# Patient Record
Sex: Male | Born: 1945 | Race: White | Hispanic: No | Smoking: Never smoker
Health system: Southern US, Community
[De-identification: ages and names within clinical notes are randomized; demographics above are authoritative.]

## PROBLEM LIST (undated history)

## (undated) DIAGNOSIS — Z9889 Other specified postprocedural states: Secondary | ICD-10-CM

## (undated) DIAGNOSIS — K529 Noninfective gastroenteritis and colitis, unspecified: Secondary | ICD-10-CM

## (undated) DIAGNOSIS — K900 Celiac disease: Secondary | ICD-10-CM

## (undated) DIAGNOSIS — F0390 Unspecified dementia without behavioral disturbance: Secondary | ICD-10-CM

## (undated) DIAGNOSIS — K632 Fistula of intestine: Secondary | ICD-10-CM

## (undated) DIAGNOSIS — K603 Anal fistula, unspecified: Secondary | ICD-10-CM

## (undated) DIAGNOSIS — C801 Malignant (primary) neoplasm, unspecified: Secondary | ICD-10-CM

## (undated) DIAGNOSIS — Z01818 Encounter for other preprocedural examination: Secondary | ICD-10-CM

## (undated) DIAGNOSIS — D496 Neoplasm of unspecified behavior of brain: Secondary | ICD-10-CM

## (undated) DIAGNOSIS — T859XXA Unspecified complication of internal prosthetic device, implant and graft, initial encounter: Secondary | ICD-10-CM

## (undated) DIAGNOSIS — D649 Anemia, unspecified: Secondary | ICD-10-CM

## (undated) DIAGNOSIS — R1310 Dysphagia, unspecified: Secondary | ICD-10-CM

## (undated) HISTORY — DX: Dysphagia, unspecified: R13.10

## (undated) HISTORY — PX: DEEP BRAIN STIMULATOR PLACEMENT: SHX608

## (undated) HISTORY — DX: Unspecified complication of internal prosthetic device, implant and graft, initial encounter: T85.9XXA

## (undated) HISTORY — DX: Encounter for other preprocedural examination: Z01.818

## (undated) HISTORY — DX: Anal fistula: K60.3

## (undated) HISTORY — DX: Noninfective gastroenteritis and colitis, unspecified: K52.9

## (undated) HISTORY — DX: Malignant (primary) neoplasm, unspecified: C80.1

## (undated) HISTORY — DX: Unspecified dementia, unspecified severity, without behavioral disturbance, psychotic disturbance, mood disturbance, and anxiety: F03.90

## (undated) HISTORY — DX: Anal fistula, unspecified: K60.30

## (undated) HISTORY — DX: Other specified postprocedural states: Z98.890

## (undated) HISTORY — DX: Anemia, unspecified: D64.9

## (undated) HISTORY — PX: PACEMAKER INSERTION: SHX728

## (undated) HISTORY — PX: COLECTOMY: SHX59

## (undated) HISTORY — DX: Fistula of intestine: K63.2

---

## 2009-06-07 ENCOUNTER — Emergency Department (HOSPITAL_COMMUNITY): Admission: EM | Admit: 2009-06-07 | Discharge: 2009-06-07 | Payer: Self-pay | Admitting: Emergency Medicine

## 2009-11-18 ENCOUNTER — Inpatient Hospital Stay (HOSPITAL_COMMUNITY): Admission: EM | Admit: 2009-11-18 | Discharge: 2009-11-27 | Payer: Self-pay | Admitting: Emergency Medicine

## 2009-11-21 ENCOUNTER — Encounter (INDEPENDENT_AMBULATORY_CARE_PROVIDER_SITE_OTHER): Payer: Self-pay | Admitting: General Surgery

## 2009-11-30 ENCOUNTER — Inpatient Hospital Stay (HOSPITAL_COMMUNITY): Admission: EM | Admit: 2009-11-30 | Discharge: 2009-12-03 | Payer: Self-pay | Admitting: Emergency Medicine

## 2009-11-30 ENCOUNTER — Ambulatory Visit: Payer: Self-pay | Admitting: Cardiology

## 2009-12-03 ENCOUNTER — Inpatient Hospital Stay: Admission: AD | Admit: 2009-12-03 | Discharge: 2009-12-28 | Payer: Self-pay | Admitting: Internal Medicine

## 2010-01-24 ENCOUNTER — Emergency Department: Payer: Self-pay | Admitting: Emergency Medicine

## 2010-03-09 ENCOUNTER — Inpatient Hospital Stay: Payer: Self-pay | Admitting: Internal Medicine

## 2010-03-27 ENCOUNTER — Ambulatory Visit: Payer: Self-pay | Admitting: Internal Medicine

## 2010-04-07 ENCOUNTER — Inpatient Hospital Stay: Payer: Self-pay | Admitting: Internal Medicine

## 2010-04-27 ENCOUNTER — Ambulatory Visit: Payer: Self-pay | Admitting: Internal Medicine

## 2010-05-24 ENCOUNTER — Emergency Department: Payer: Self-pay | Admitting: Emergency Medicine

## 2010-06-17 ENCOUNTER — Emergency Department: Payer: Self-pay | Admitting: Emergency Medicine

## 2010-07-10 LAB — BASIC METABOLIC PANEL
BUN: 21 mg/dL (ref 6–23)
Calcium: 7.9 mg/dL — ABNORMAL LOW (ref 8.4–10.5)
Creatinine, Ser: 0.62 mg/dL (ref 0.4–1.5)
Potassium: 3.6 mEq/L (ref 3.5–5.1)

## 2010-07-11 LAB — CBC
HCT: 29.2 % — ABNORMAL LOW (ref 39.0–52.0)
HCT: 30.3 % — ABNORMAL LOW (ref 39.0–52.0)
HCT: 30.9 % — ABNORMAL LOW (ref 39.0–52.0)
HCT: 32.6 % — ABNORMAL LOW (ref 39.0–52.0)
HCT: 27.7 % — ABNORMAL LOW (ref 39.0–52.0)
HCT: 30.5 % — ABNORMAL LOW (ref 39.0–52.0)
HCT: 31.5 % — ABNORMAL LOW (ref 39.0–52.0)
HCT: 33.4 % — ABNORMAL LOW (ref 39.0–52.0)
Hemoglobin: 10.3 g/dL — ABNORMAL LOW (ref 13.0–17.0)
Hemoglobin: 10.7 g/dL — ABNORMAL LOW (ref 13.0–17.0)
Hemoglobin: 11 g/dL — ABNORMAL LOW (ref 13.0–17.0)
Hemoglobin: 9.5 g/dL — ABNORMAL LOW (ref 13.0–17.0)
Hemoglobin: 9.7 g/dL — ABNORMAL LOW (ref 13.0–17.0)
Hemoglobin: 10.4 g/dL — ABNORMAL LOW (ref 13.0–17.0)
Hemoglobin: 10.9 g/dL — ABNORMAL LOW (ref 13.0–17.0)
Hemoglobin: 9 g/dL — ABNORMAL LOW (ref 13.0–17.0)
Hemoglobin: 9.5 g/dL — ABNORMAL LOW (ref 13.0–17.0)
Hemoglobin: 11.1 g/dL — ABNORMAL LOW (ref 13.0–17.0)
MCH: 31 pg (ref 26.0–34.0)
MCH: 31.3 pg (ref 26.0–34.0)
MCH: 31.5 pg (ref 26.0–34.0)
MCH: 32.1 pg (ref 26.0–34.0)
MCH: 32.3 pg (ref 26.0–34.0)
MCH: 32.6 pg (ref 26.0–34.0)
MCH: 32.7 pg (ref 26.0–34.0)
MCH: 32.8 pg (ref 26.0–34.0)
MCH: 31.1 pg (ref 26.0–34.0)
MCHC: 32.4 g/dL (ref 30.0–36.0)
MCHC: 32.8 g/dL (ref 30.0–36.0)
MCHC: 33.2 g/dL (ref 30.0–36.0)
MCHC: 34 g/dL (ref 30.0–36.0)
MCHC: 34.1 g/dL (ref 30.0–36.0)
MCHC: 34.2 g/dL (ref 30.0–36.0)
MCHC: 34.3 g/dL (ref 30.0–36.0)
MCHC: 34.4 g/dL (ref 30.0–36.0)
MCHC: 34.5 g/dL (ref 30.0–36.0)
MCHC: 34.6 g/dL (ref 30.0–36.0)
MCHC: 32.2 g/dL (ref 30.0–36.0)
MCV: 92.1 fL (ref 78.0–100.0)
MCV: 92.4 fL (ref 78.0–100.0)
MCV: 93.6 fL (ref 78.0–100.0)
MCV: 93.9 fL (ref 78.0–100.0)
MCV: 93.9 fL (ref 78.0–100.0)
MCV: 93.8 fL (ref 78.0–100.0)
MCV: 94.5 fL (ref 78.0–100.0)
MCV: 94.6 fL (ref 78.0–100.0)
MCV: 94.6 fL (ref 78.0–100.0)
MCV: 95.2 fL (ref 78.0–100.0)
MCV: 92.4 fL (ref 78.0–100.0)
Platelets: 189 10*3/uL (ref 150–400)
Platelets: 208 10*3/uL (ref 150–400)
Platelets: 210 10*3/uL (ref 150–400)
Platelets: 222 10*3/uL (ref 150–400)
Platelets: 235 10*3/uL (ref 150–400)
Platelets: 401 10*3/uL — ABNORMAL HIGH (ref 150–400)
Platelets: 403 10*3/uL — ABNORMAL HIGH (ref 150–400)
Platelets: 452 10*3/uL — ABNORMAL HIGH (ref 150–400)
Platelets: 266 10*3/uL (ref 150–400)
Platelets: 367 10*3/uL (ref 150–400)
Platelets: 417 10*3/uL — ABNORMAL HIGH (ref 150–400)
RBC: 3.11 MIL/uL — ABNORMAL LOW (ref 4.22–5.81)
RBC: 3.29 MIL/uL — ABNORMAL LOW (ref 4.22–5.81)
RBC: 3.46 MIL/uL — ABNORMAL LOW (ref 4.22–5.81)
RBC: 3.6 MIL/uL — ABNORMAL LOW (ref 4.22–5.81)
RBC: 2.39 MIL/uL — ABNORMAL LOW (ref 4.22–5.81)
RBC: 2.8 MIL/uL — ABNORMAL LOW (ref 4.22–5.81)
RBC: 3.22 MIL/uL — ABNORMAL LOW (ref 4.22–5.81)
RBC: 3.41 MIL/uL — ABNORMAL LOW (ref 4.22–5.81)
RDW: 14.6 % (ref 11.5–15.5)
RDW: 14.8 % (ref 11.5–15.5)
RDW: 15.2 % (ref 11.5–15.5)
RDW: 15.2 % (ref 11.5–15.5)
RDW: 15.5 % (ref 11.5–15.5)
RDW: 15.6 % — ABNORMAL HIGH (ref 11.5–15.5)
RDW: 14.6 % (ref 11.5–15.5)
RDW: 14.8 % (ref 11.5–15.5)
RDW: 14.2 % (ref 11.5–15.5)
RDW: 14.5 % (ref 11.5–15.5)
WBC: 5 10*3/uL (ref 4.0–10.5)
WBC: 5.6 10*3/uL (ref 4.0–10.5)
WBC: 6.7 10*3/uL (ref 4.0–10.5)
WBC: 6.8 10*3/uL (ref 4.0–10.5)
WBC: 9.9 10*3/uL (ref 4.0–10.5)
WBC: 8.6 10*3/uL (ref 4.0–10.5)
WBC: 9.6 10*3/uL (ref 4.0–10.5)
WBC: 5.9 10*3/uL (ref 4.0–10.5)
WBC: 6.4 10*3/uL (ref 4.0–10.5)
WBC: 7.5 10*3/uL (ref 4.0–10.5)

## 2010-07-11 LAB — URINE MICROSCOPIC-ADD ON

## 2010-07-11 LAB — URINALYSIS, ROUTINE W REFLEX MICROSCOPIC
Ketones, ur: 15 mg/dL — AB
Ketones, ur: 15 mg/dL — AB
Nitrite: NEGATIVE
Specific Gravity, Urine: 1.03 (ref 1.005–1.030)
pH: 5.5 (ref 5.0–8.0)

## 2010-07-11 LAB — BASIC METABOLIC PANEL
BUN: 29 mg/dL — ABNORMAL HIGH (ref 6–23)
BUN: 29 mg/dL — ABNORMAL HIGH (ref 6–23)
BUN: 35 mg/dL — ABNORMAL HIGH (ref 6–23)
BUN: 35 mg/dL — ABNORMAL HIGH (ref 6–23)
BUN: 43 mg/dL — ABNORMAL HIGH (ref 6–23)
BUN: 14 mg/dL (ref 6–23)
CO2: 24 mEq/L (ref 19–32)
CO2: 25 mEq/L (ref 19–32)
CO2: 24 mEq/L (ref 19–32)
CO2: 27 mEq/L (ref 19–32)
CO2: 29 mEq/L (ref 19–32)
Calcium: 8 mg/dL — ABNORMAL LOW (ref 8.4–10.5)
Calcium: 6.7 mg/dL — ABNORMAL LOW (ref 8.4–10.5)
Calcium: 7 mg/dL — ABNORMAL LOW (ref 8.4–10.5)
Calcium: 7.3 mg/dL — ABNORMAL LOW (ref 8.4–10.5)
Chloride: 104 mEq/L (ref 96–112)
Chloride: 106 mEq/L (ref 96–112)
Chloride: 107 mEq/L (ref 96–112)
Chloride: 109 mEq/L (ref 96–112)
Chloride: 101 mEq/L (ref 96–112)
Chloride: 101 mEq/L (ref 96–112)
Creatinine, Ser: 0.73 mg/dL (ref 0.4–1.5)
Creatinine, Ser: 0.92 mg/dL (ref 0.4–1.5)
Creatinine, Ser: 0.94 mg/dL (ref 0.4–1.5)
Creatinine, Ser: 0.94 mg/dL (ref 0.4–1.5)
Creatinine, Ser: 0.71 mg/dL (ref 0.4–1.5)
Creatinine, Ser: 0.78 mg/dL (ref 0.4–1.5)
Creatinine, Ser: 0.82 mg/dL (ref 0.4–1.5)
GFR calc Af Amer: >60 mL/min (ref >60)
GFR calc Af Amer: >60 mL/min (ref >60)
GFR calc Af Amer: >60 mL/min (ref >60)
GFR calc Af Amer: >60 mL/min (ref >60)
GFR calc Af Amer: >60 mL/min (ref >60)
GFR calc Af Amer: >60 mL/min (ref >60)
GFR calc Af Amer: >60 mL/min (ref >60)
GFR calc Af Amer: >60 mL/min (ref >60)
GFR calc Af Amer: >60 mL/min (ref >60)
GFR calc non Af Amer: >60 mL/min (ref >60)
GFR calc non Af Amer: >60 mL/min (ref >60)
GFR calc non Af Amer: >60 mL/min (ref >60)
GFR calc non Af Amer: >60 mL/min (ref >60)
GFR calc non Af Amer: >60 mL/min (ref >60)
GFR calc non Af Amer: >60 mL/min (ref >60)
GFR calc non Af Amer: >60 mL/min (ref >60)
GFR calc non Af Amer: >60 mL/min (ref >60)
GFR calc non Af Amer: >60 mL/min (ref >60)
GFR calc non Af Amer: >60 mL/min (ref >60)
Glucose, Bld: 114 mg/dL — ABNORMAL HIGH (ref 70–99)
Glucose, Bld: 116 mg/dL — ABNORMAL HIGH (ref 70–99)
Glucose, Bld: 105 mg/dL — ABNORMAL HIGH (ref 70–99)
Potassium: 3.3 mEq/L — ABNORMAL LOW (ref 3.5–5.1)
Potassium: 3.4 mEq/L — ABNORMAL LOW (ref 3.5–5.1)
Potassium: 3.4 mEq/L — ABNORMAL LOW (ref 3.5–5.1)
Potassium: 3.5 mEq/L (ref 3.5–5.1)
Potassium: 3.6 mEq/L (ref 3.5–5.1)
Potassium: 4.1 mEq/L (ref 3.5–5.1)
Potassium: 4.5 mEq/L (ref 3.5–5.1)
Potassium: 3.2 mEq/L — ABNORMAL LOW (ref 3.5–5.1)
Potassium: 3.8 mEq/L (ref 3.5–5.1)
Sodium: 136 mEq/L (ref 135–145)
Sodium: 138 mEq/L (ref 135–145)
Sodium: 138 mEq/L (ref 135–145)
Sodium: 139 mEq/L (ref 135–145)
Sodium: 140 mEq/L (ref 135–145)
Sodium: 139 mEq/L (ref 135–145)
Sodium: 137 mEq/L (ref 135–145)
Sodium: 139 mEq/L (ref 135–145)

## 2010-07-11 LAB — COMPREHENSIVE METABOLIC PANEL
ALT: 22 U/L (ref 0–53)
AST: 29 U/L (ref 0–37)
AST: 37 U/L (ref 0–37)
AST: 20 U/L (ref 0–37)
AST: 20 U/L (ref 0–37)
Albumin: 1.8 g/dL — ABNORMAL LOW (ref 3.5–5.2)
Albumin: 1.9 g/dL — ABNORMAL LOW (ref 3.5–5.2)
Albumin: 1.6 g/dL — ABNORMAL LOW (ref 3.5–5.2)
Albumin: 1.8 g/dL — ABNORMAL LOW (ref 3.5–5.2)
Albumin: 2.1 g/dL — ABNORMAL LOW (ref 3.5–5.2)
Alkaline Phosphatase: 133 U/L — ABNORMAL HIGH (ref 39–117)
BUN: 25 mg/dL — ABNORMAL HIGH (ref 6–23)
BUN: 32 mg/dL — ABNORMAL HIGH (ref 6–23)
BUN: 34 mg/dL — ABNORMAL HIGH (ref 6–23)
BUN: 11 mg/dL (ref 6–23)
BUN: 11 mg/dL (ref 6–23)
BUN: 12 mg/dL (ref 6–23)
BUN: 13 mg/dL (ref 6–23)
CO2: 28 mEq/L (ref 19–32)
CO2: 28 mEq/L (ref 19–32)
CO2: 29 mEq/L (ref 19–32)
CO2: 32 mEq/L (ref 19–32)
Calcium: 6.7 mg/dL — ABNORMAL LOW (ref 8.4–10.5)
Calcium: 6.8 mg/dL — ABNORMAL LOW (ref 8.4–10.5)
Calcium: 7.2 mg/dL — ABNORMAL LOW (ref 8.4–10.5)
Chloride: 103 mEq/L (ref 96–112)
Chloride: 110 mEq/L (ref 96–112)
Chloride: 111 mEq/L (ref 96–112)
Chloride: 116 mEq/L — ABNORMAL HIGH (ref 96–112)
Chloride: 115 mEq/L — ABNORMAL HIGH (ref 96–112)
Creatinine, Ser: 0.62 mg/dL (ref 0.4–1.5)
Creatinine, Ser: 0.78 mg/dL (ref 0.4–1.5)
Creatinine, Ser: 0.91 mg/dL (ref 0.4–1.5)
Creatinine, Ser: 0.72 mg/dL (ref 0.4–1.5)
Creatinine, Ser: 0.86 mg/dL (ref 0.4–1.5)
Creatinine, Ser: 0.96 mg/dL (ref 0.4–1.5)
Creatinine, Ser: 0.85 mg/dL (ref 0.4–1.5)
GFR calc Af Amer: >60 mL/min (ref >60)
GFR calc Af Amer: >60 mL/min (ref >60)
GFR calc non Af Amer: >60 mL/min (ref >60)
GFR calc non Af Amer: >60 mL/min (ref >60)
GFR calc non Af Amer: >60 mL/min (ref >60)
GFR calc non Af Amer: >60 mL/min (ref >60)
Glucose, Bld: 133 mg/dL — ABNORMAL HIGH (ref 70–99)
Glucose, Bld: 138 mg/dL — ABNORMAL HIGH (ref 70–99)
Glucose, Bld: 133 mg/dL — ABNORMAL HIGH (ref 70–99)
Potassium: 3.7 mEq/L (ref 3.5–5.1)
Potassium: 3.5 mEq/L (ref 3.5–5.1)
Sodium: 140 mEq/L (ref 135–145)
Total Bilirubin: 0.6 mg/dL (ref 0.3–1.2)
Total Bilirubin: 0.6 mg/dL (ref 0.3–1.2)
Total Bilirubin: 1 mg/dL (ref 0.3–1.2)
Total Bilirubin: 1.2 mg/dL (ref 0.3–1.2)
Total Bilirubin: 0.6 mg/dL (ref 0.3–1.2)
Total Protein: 5.8 g/dL — ABNORMAL LOW (ref 6.0–8.3)
Total Protein: 6.4 g/dL (ref 6.0–8.3)
Total Protein: 4.6 g/dL — ABNORMAL LOW (ref 6.0–8.3)
Total Protein: 5.4 g/dL — ABNORMAL LOW (ref 6.0–8.3)

## 2010-07-11 LAB — CLOSTRIDIUM DIFFICILE EIA
C difficile Toxins A+B, EIA: NEGATIVE
C difficile Toxins A+B, EIA: NEGATIVE

## 2010-07-11 LAB — CULTURE, BLOOD (SINGLE)

## 2010-07-11 LAB — CROSSMATCH
ABO/RH(D): A NEG
Antibody Screen: NEGATIVE

## 2010-07-11 LAB — DIFFERENTIAL
Basophils Absolute: 0 10*3/uL (ref 0.0–0.1)
Basophils Absolute: 0 10*3/uL (ref 0.0–0.1)
Basophils Absolute: 0 10*3/uL (ref 0.0–0.1)
Basophils Absolute: 0 10*3/uL (ref 0.0–0.1)
Basophils Absolute: 0 10*3/uL (ref 0.0–0.1)
Basophils Relative: 0 % (ref 0–1)
Basophils Relative: 0 % (ref 0–1)
Basophils Relative: 0 % (ref 0–1)
Eosinophils Absolute: 0 10*3/uL (ref 0.0–0.7)
Eosinophils Absolute: 0.1 10*3/uL (ref 0.0–0.7)
Eosinophils Relative: 1 % (ref 0–5)
Eosinophils Relative: 0 % (ref 0–5)
Lymphocytes Relative: 12 % (ref 12–46)
Lymphocytes Relative: 13 % (ref 12–46)
Lymphocytes Relative: 5 % — ABNORMAL LOW (ref 12–46)
Lymphocytes Relative: 5 % — ABNORMAL LOW (ref 12–46)
Lymphocytes Relative: 5 % — ABNORMAL LOW (ref 12–46)
Lymphocytes Relative: 7 % — ABNORMAL LOW (ref 12–46)
Lymphs Abs: 1.3 10*3/uL (ref 0.7–4.0)
Lymphs Abs: 0.6 10*3/uL — ABNORMAL LOW (ref 0.7–4.0)
Lymphs Abs: 0.6 10*3/uL — ABNORMAL LOW (ref 0.7–4.0)
Monocytes Absolute: 0.7 10*3/uL (ref 0.1–1.0)
Monocytes Absolute: 0.5 10*3/uL (ref 0.1–1.0)
Monocytes Absolute: 0.5 10*3/uL (ref 0.1–1.0)
Monocytes Absolute: 0.5 10*3/uL (ref 0.1–1.0)
Monocytes Absolute: 0.7 10*3/uL (ref 0.1–1.0)
Monocytes Absolute: 1 10*3/uL (ref 0.1–1.0)
Monocytes Relative: 7 % (ref 3–12)
Monocytes Relative: 11 % (ref 3–12)
Monocytes Relative: 4 % (ref 3–12)
Monocytes Relative: 6 % (ref 3–12)
Monocytes Relative: 7 % (ref 3–12)
Neutro Abs: 5.5 10*3/uL (ref 1.7–7.7)
Neutro Abs: 11 10*3/uL — ABNORMAL HIGH (ref 1.7–7.7)
Neutro Abs: 7.1 10*3/uL (ref 1.7–7.7)
Neutro Abs: 7.6 10*3/uL (ref 1.7–7.7)
Neutro Abs: 8.4 10*3/uL — ABNORMAL HIGH (ref 1.7–7.7)
Neutro Abs: 9.5 10*3/uL — ABNORMAL HIGH (ref 1.7–7.7)
Neutrophils Relative %: 81 % — ABNORMAL HIGH (ref 43–77)
Neutrophils Relative %: 87 % — ABNORMAL HIGH (ref 43–77)
Neutrophils Relative %: 92 % — ABNORMAL HIGH (ref 43–77)

## 2010-07-11 LAB — BLOOD GAS, ARTERIAL
Acid-Base Excess: 1.2 mmol/L (ref 0.0–2.0)
Bicarbonate: 25.8 mEq/L — ABNORMAL HIGH (ref 20.0–24.0)
Delivery systems: POSITIVE
Expiratory PAP: 8
FIO2: 100 %
Inspiratory PAP: 16
O2 Content: 6 L/min
O2 Saturation: 98.8 %
Patient temperature: 37
TCO2: 21.9 mmol/L (ref 0–100)
pCO2 arterial: 35 mmHg (ref 35.0–45.0)
pH, Arterial: 7.443 (ref 7.350–7.450)
pO2, Arterial: 146 mmHg — ABNORMAL HIGH (ref 80.0–100.0)

## 2010-07-11 LAB — URINE CULTURE
Colony Count: 60000
Colony Count: NO GROWTH
Culture  Setup Time: 201108211832
Culture  Setup Time: 201108252238

## 2010-07-11 LAB — PHOSPHORUS
Phosphorus: 2.7 mg/dL (ref 2.3–4.6)
Phosphorus: 3.7 mg/dL (ref 2.3–4.6)
Phosphorus: 3.2 mg/dL (ref 2.3–4.6)
Phosphorus: 3.2 mg/dL (ref 2.3–4.6)

## 2010-07-11 LAB — CULTURE, BLOOD (ROUTINE X 2)
Culture: NO GROWTH
Culture: NO GROWTH

## 2010-07-11 LAB — VITAMIN D 25 HYDROXY (VIT D DEFICIENCY, FRACTURES)
Vit D, 25-Hydroxy: 29 ng/mL — ABNORMAL LOW (ref 30–89)
Vit D, 25-Hydroxy: 10 ng/mL — ABNORMAL LOW (ref 30–89)

## 2010-07-11 LAB — MAGNESIUM
Magnesium: 1.9 mg/dL (ref 1.5–2.5)
Magnesium: 2.1 mg/dL (ref 1.5–2.5)
Magnesium: 1.8 mg/dL (ref 1.5–2.5)
Magnesium: 1.9 mg/dL (ref 1.5–2.5)
Magnesium: 2.1 mg/dL (ref 1.5–2.5)
Magnesium: 2.1 mg/dL (ref 1.5–2.5)

## 2010-07-11 LAB — CULTURE, RESPIRATORY W GRAM STAIN

## 2010-07-11 LAB — MRSA PCR SCREENING: MRSA by PCR: NEGATIVE

## 2010-07-11 LAB — GLUCOSE, CAPILLARY: Glucose-Capillary: 88 mg/dL (ref 70–99)

## 2010-07-11 LAB — FERRITIN: Ferritin: 928 ng/mL — ABNORMAL HIGH (ref 22–322)

## 2010-07-11 LAB — IRON: Iron: 35 ug/dL — ABNORMAL LOW (ref 42–135)

## 2010-07-11 LAB — VANCOMYCIN, TROUGH: Vancomycin Tr: 23.1 ug/mL — ABNORMAL HIGH (ref 10.0–20.0)

## 2010-07-11 LAB — PREALBUMIN: Prealbumin: 4.7 mg/dL — ABNORMAL LOW (ref 18.0–45.0)

## 2010-07-11 LAB — ALBUMIN
Albumin: 2.2 g/dL — ABNORMAL LOW (ref 3.5–5.2)
Albumin: 2.2 g/dL — ABNORMAL LOW (ref 3.5–5.2)

## 2010-07-11 LAB — HIGH SENSITIVITY CRP: CRP, High Sensitivity: 33 mg/L — ABNORMAL HIGH

## 2010-07-12 LAB — BASIC METABOLIC PANEL
BUN: 18 mg/dL (ref 6–23)
BUN: 21 mg/dL (ref 6–23)
BUN: 23 mg/dL (ref 6–23)
BUN: 23 mg/dL (ref 6–23)
BUN: 25 mg/dL — ABNORMAL HIGH (ref 6–23)
CO2: 22 mEq/L (ref 19–32)
CO2: 23 mEq/L (ref 19–32)
CO2: 23 mEq/L (ref 19–32)
CO2: 24 mEq/L (ref 19–32)
Calcium: 9 mg/dL (ref 8.4–10.5)
Calcium: 9.2 mg/dL (ref 8.4–10.5)
Calcium: 9.4 mg/dL (ref 8.4–10.5)
Chloride: 111 mEq/L (ref 96–112)
Chloride: 111 mEq/L (ref 96–112)
Chloride: 111 mEq/L (ref 96–112)
Chloride: 112 mEq/L (ref 96–112)
Creatinine, Ser: 0.83 mg/dL (ref 0.4–1.5)
Creatinine, Ser: 0.88 mg/dL (ref 0.4–1.5)
Creatinine, Ser: 0.99 mg/dL (ref 0.4–1.5)
Creatinine, Ser: 1.02 mg/dL (ref 0.4–1.5)
GFR calc Af Amer: >60 mL/min (ref >60)
GFR calc Af Amer: >60 mL/min (ref >60)
GFR calc Af Amer: >60 mL/min (ref >60)
GFR calc non Af Amer: >60 mL/min (ref >60)
GFR calc non Af Amer: >60 mL/min (ref >60)
GFR calc non Af Amer: >60 mL/min (ref >60)
GFR calc non Af Amer: >60 mL/min (ref >60)
Glucose, Bld: 110 mg/dL — ABNORMAL HIGH (ref 70–99)
Glucose, Bld: 114 mg/dL — ABNORMAL HIGH (ref 70–99)
Glucose, Bld: 147 mg/dL — ABNORMAL HIGH (ref 70–99)
Potassium: 2.7 mEq/L — CL (ref 3.5–5.1)
Potassium: 3 mEq/L — ABNORMAL LOW (ref 3.5–5.1)
Potassium: 3.9 mEq/L (ref 3.5–5.1)
Sodium: 140 mEq/L (ref 135–145)
Sodium: 142 mEq/L (ref 135–145)
Sodium: 144 mEq/L (ref 135–145)

## 2010-07-12 LAB — CBC
HCT: 31.8 % — ABNORMAL LOW (ref 39.0–52.0)
HCT: 36.5 % — ABNORMAL LOW (ref 39.0–52.0)
Hemoglobin: 11.1 g/dL — ABNORMAL LOW (ref 13.0–17.0)
Hemoglobin: 13.3 g/dL (ref 13.0–17.0)
Hemoglobin: 13.7 g/dL (ref 13.0–17.0)
MCH: 32.6 pg (ref 26.0–34.0)
MCH: 32.7 pg (ref 26.0–34.0)
MCH: 32.9 pg (ref 26.0–34.0)
MCHC: 34.5 g/dL (ref 30.0–36.0)
MCHC: 34.5 g/dL (ref 30.0–36.0)
MCHC: 34.5 g/dL (ref 30.0–36.0)
MCHC: 35 g/dL (ref 30.0–36.0)
MCHC: 35.2 g/dL (ref 30.0–36.0)
MCV: 93 fL (ref 78.0–100.0)
MCV: 94.4 fL (ref 78.0–100.0)
Platelets: 125 10*3/uL — ABNORMAL LOW (ref 150–400)
Platelets: 132 10*3/uL — ABNORMAL LOW (ref 150–400)
Platelets: 153 10*3/uL (ref 150–400)
RBC: 3.37 MIL/uL — ABNORMAL LOW (ref 4.22–5.81)
RBC: 3.39 MIL/uL — ABNORMAL LOW (ref 4.22–5.81)
RBC: 3.93 MIL/uL — ABNORMAL LOW (ref 4.22–5.81)
RDW: 14.8 % (ref 11.5–15.5)
RDW: 15 % (ref 11.5–15.5)
RDW: 15.3 % (ref 11.5–15.5)
RDW: 15.4 % (ref 11.5–15.5)
WBC: 6.9 10*3/uL (ref 4.0–10.5)
WBC: 8 10*3/uL (ref 4.0–10.5)

## 2010-07-12 LAB — MAGNESIUM
Magnesium: 1.5 mg/dL (ref 1.5–2.5)
Magnesium: 1.6 mg/dL (ref 1.5–2.5)
Magnesium: 1.9 mg/dL (ref 1.5–2.5)
Magnesium: 2.1 mg/dL (ref 1.5–2.5)
Magnesium: 2.2 mg/dL (ref 1.5–2.5)

## 2010-07-12 LAB — DIFFERENTIAL
Basophils Absolute: 0 10*3/uL (ref 0.0–0.1)
Basophils Absolute: 0 10*3/uL (ref 0.0–0.1)
Basophils Absolute: 0 10*3/uL (ref 0.0–0.1)
Basophils Absolute: 0 10*3/uL (ref 0.0–0.1)
Basophils Absolute: 0 10*3/uL (ref 0.0–0.1)
Basophils Absolute: 0 10*3/uL (ref 0.0–0.1)
Basophils Relative: 0 % (ref 0–1)
Basophils Relative: 0 % (ref 0–1)
Basophils Relative: 0 % (ref 0–1)
Basophils Relative: 0 % (ref 0–1)
Basophils Relative: 0 % (ref 0–1)
Eosinophils Absolute: 0 10*3/uL (ref 0.0–0.7)
Eosinophils Absolute: 0 10*3/uL (ref 0.0–0.7)
Eosinophils Absolute: 0 10*3/uL (ref 0.0–0.7)
Eosinophils Relative: 0 % (ref 0–5)
Eosinophils Relative: 0 % (ref 0–5)
Eosinophils Relative: 0 % (ref 0–5)
Lymphocytes Relative: 12 % (ref 12–46)
Lymphocytes Relative: 6 % — ABNORMAL LOW (ref 12–46)
Lymphocytes Relative: 8 % — ABNORMAL LOW (ref 12–46)
Lymphs Abs: 0.8 10*3/uL (ref 0.7–4.0)
Monocytes Absolute: 0.5 10*3/uL (ref 0.1–1.0)
Monocytes Absolute: 0.6 10*3/uL (ref 0.1–1.0)
Monocytes Absolute: 0.6 10*3/uL (ref 0.1–1.0)
Monocytes Relative: 8 % (ref 3–12)
Monocytes Relative: 8 % (ref 3–12)
Neutro Abs: 10 10*3/uL — ABNORMAL HIGH (ref 1.7–7.7)
Neutro Abs: 5.6 10*3/uL (ref 1.7–7.7)
Neutro Abs: 6.5 10*3/uL (ref 1.7–7.7)
Neutro Abs: 6.7 10*3/uL (ref 1.7–7.7)
Neutro Abs: 7.2 10*3/uL (ref 1.7–7.7)
Neutrophils Relative %: 80 % — ABNORMAL HIGH (ref 43–77)
Neutrophils Relative %: 83 % — ABNORMAL HIGH (ref 43–77)
Neutrophils Relative %: 87 % — ABNORMAL HIGH (ref 43–77)
Neutrophils Relative %: 87 % — ABNORMAL HIGH (ref 43–77)

## 2010-07-12 LAB — CROSSMATCH
ABO/RH(D): A NEG
Antibody Screen: NEGATIVE

## 2010-07-12 LAB — ALBUMIN
Albumin: 2.5 g/dL — ABNORMAL LOW (ref 3.5–5.2)
Albumin: 2.5 g/dL — ABNORMAL LOW (ref 3.5–5.2)

## 2010-07-12 LAB — URINALYSIS, ROUTINE W REFLEX MICROSCOPIC
Ketones, ur: 40 mg/dL — AB
Protein, ur: 30 mg/dL — AB
Urobilinogen, UA: 1 mg/dL (ref 0.0–1.0)

## 2010-07-12 LAB — COMPREHENSIVE METABOLIC PANEL
ALT: 9 U/L (ref 0–53)
AST: 14 U/L (ref 0–37)
Albumin: 2.1 g/dL — ABNORMAL LOW (ref 3.5–5.2)
Alkaline Phosphatase: 90 U/L (ref 39–117)
CO2: 23 mEq/L (ref 19–32)
Calcium: 7.9 mg/dL — ABNORMAL LOW (ref 8.4–10.5)
Chloride: 113 mEq/L — ABNORMAL HIGH (ref 96–112)
GFR calc Af Amer: >60 mL/min (ref >60)
Sodium: 140 mEq/L (ref 135–145)
Total Bilirubin: 0.9 mg/dL (ref 0.3–1.2)

## 2010-07-12 LAB — POCT I-STAT 4, (NA,K, GLUC, HGB,HCT): HCT: 39 % (ref 39.0–52.0)

## 2010-07-12 LAB — POTASSIUM
Potassium: 2.6 mEq/L — CL (ref 3.5–5.1)
Potassium: 2.8 mEq/L — ABNORMAL LOW (ref 3.5–5.1)
Potassium: 3.6 mEq/L (ref 3.5–5.1)

## 2010-07-12 LAB — PHOSPHORUS
Phosphorus: 1 mg/dL — CL (ref 2.3–4.6)
Phosphorus: 2.2 mg/dL — ABNORMAL LOW (ref 2.3–4.6)

## 2010-07-12 LAB — MRSA PCR SCREENING: MRSA by PCR: NEGATIVE

## 2010-07-12 LAB — ABO/RH: ABO/RH(D): A NEG

## 2010-07-17 LAB — COMPREHENSIVE METABOLIC PANEL
ALT: 11 U/L (ref 0–53)
AST: 28 U/L (ref 0–37)
Albumin: 4 g/dL (ref 3.5–5.2)
Alkaline Phosphatase: 98 U/L (ref 39–117)
Chloride: 102 mEq/L (ref 96–112)
GFR calc Af Amer: >60 mL/min (ref >60)
Potassium: 3.4 mEq/L — ABNORMAL LOW (ref 3.5–5.1)
Sodium: 140 mEq/L (ref 135–145)
Total Bilirubin: 1.1 mg/dL (ref 0.3–1.2)

## 2010-07-17 LAB — DIFFERENTIAL
Basophils Absolute: 0 10*3/uL (ref 0.0–0.1)
Basophils Relative: 0 % (ref 0–1)
Eosinophils Absolute: 0.1 10*3/uL (ref 0.0–0.7)
Eosinophils Relative: 2 % (ref 0–5)
Lymphocytes Relative: 18 % (ref 12–46)
Monocytes Absolute: 0.1 10*3/uL (ref 0.1–1.0)

## 2010-07-17 LAB — CBC
Platelets: 145 10*3/uL — ABNORMAL LOW (ref 150–400)
RBC: 4.38 MIL/uL (ref 4.22–5.81)
WBC: 5.7 10*3/uL (ref 4.0–10.5)

## 2010-09-15 ENCOUNTER — Emergency Department (HOSPITAL_COMMUNITY): Payer: Medicare Other

## 2010-09-15 ENCOUNTER — Emergency Department (HOSPITAL_COMMUNITY)
Admission: EM | Admit: 2010-09-15 | Discharge: 2010-09-15 | Disposition: A | Discharge status: Home / Self Care | Payer: Medicare Other | Attending: Emergency Medicine | Admitting: Emergency Medicine

## 2010-09-15 DIAGNOSIS — G20 Parkinson's disease: Secondary | ICD-10-CM | POA: Insufficient documentation

## 2010-09-15 DIAGNOSIS — J4489 Other specified chronic obstructive pulmonary disease: Secondary | ICD-10-CM | POA: Insufficient documentation

## 2010-09-15 DIAGNOSIS — J449 Chronic obstructive pulmonary disease, unspecified: Secondary | ICD-10-CM | POA: Insufficient documentation

## 2010-09-15 DIAGNOSIS — Z189 Retained foreign body fragments, unspecified material: Secondary | ICD-10-CM | POA: Insufficient documentation

## 2010-09-15 DIAGNOSIS — S0100XA Unspecified open wound of scalp, initial encounter: Secondary | ICD-10-CM | POA: Insufficient documentation

## 2010-09-15 DIAGNOSIS — G20A1 Parkinson's disease without dyskinesia, without mention of fluctuations: Secondary | ICD-10-CM | POA: Insufficient documentation

## 2010-09-15 DIAGNOSIS — Z9889 Other specified postprocedural states: Secondary | ICD-10-CM | POA: Insufficient documentation

## 2010-09-15 DIAGNOSIS — Z79899 Other long term (current) drug therapy: Secondary | ICD-10-CM | POA: Insufficient documentation

## 2010-09-15 DIAGNOSIS — W050XXA Fall from non-moving wheelchair, initial encounter: Secondary | ICD-10-CM | POA: Insufficient documentation

## 2010-09-15 DIAGNOSIS — R259 Unspecified abnormal involuntary movements: Secondary | ICD-10-CM | POA: Insufficient documentation

## 2010-09-15 DIAGNOSIS — R4789 Other speech disturbances: Secondary | ICD-10-CM | POA: Insufficient documentation

## 2010-09-15 DIAGNOSIS — Y9210 Unspecified residential institution as the place of occurrence of the external cause: Secondary | ICD-10-CM | POA: Insufficient documentation

## 2011-06-11 ENCOUNTER — Emergency Department (HOSPITAL_COMMUNITY): Payer: Medicare Other

## 2011-06-11 ENCOUNTER — Emergency Department (HOSPITAL_COMMUNITY)
Admission: EM | Admit: 2011-06-11 | Discharge: 2011-06-11 | Disposition: A | Discharge status: Skilled Nursing Facility | Payer: Medicare Other | Attending: Emergency Medicine | Admitting: Emergency Medicine

## 2011-06-11 ENCOUNTER — Encounter (HOSPITAL_COMMUNITY): Payer: Self-pay

## 2011-06-11 DIAGNOSIS — T17308A Unspecified foreign body in larynx causing other injury, initial encounter: Secondary | ICD-10-CM | POA: Insufficient documentation

## 2011-06-11 DIAGNOSIS — G20A1 Parkinson's disease without dyskinesia, without mention of fluctuations: Secondary | ICD-10-CM | POA: Insufficient documentation

## 2011-06-11 DIAGNOSIS — G20 Parkinson's disease: Secondary | ICD-10-CM | POA: Insufficient documentation

## 2011-06-11 DIAGNOSIS — Y9210 Unspecified residential institution as the place of occurrence of the external cause: Secondary | ICD-10-CM | POA: Insufficient documentation

## 2011-06-11 DIAGNOSIS — R0989 Other specified symptoms and signs involving the circulatory and respiratory systems: Secondary | ICD-10-CM

## 2011-06-11 DIAGNOSIS — IMO0002 Reserved for concepts with insufficient information to code with codable children: Secondary | ICD-10-CM | POA: Insufficient documentation

## 2011-06-11 HISTORY — DX: Neoplasm of unspecified behavior of brain: D49.6

## 2011-06-11 NOTE — ED Notes (Signed)
Changed pt's adult brief.  Gave pt ice chips.  Pt denies needs at this time.

## 2011-06-11 NOTE — ED Notes (Signed)
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

## 2011-06-11 NOTE — ED Provider Notes (Signed)
History     CSN: [id number]  Arrival date & time 06/11/11  1705   First MD Initiated Contact with Patient 06/11/11 1715      Chief Complaint  Patient presents with  . Choking    Pt was at nursing home [facility].  Pt choked on candy and staff gave back blows to dislodge.  EMS was called and pt coughed up rest of candy with them.  Per EMS, EKG changes were noted, ?LBBB.      (Consider location/radiation/quality/duration/timing/severity/associated sxs/prior treatment) HPI Hx provided by the pt and EMS.  Pt arrived via EMS.  66 y.o. M with h/o parkinson's brought by EMS s/p choking on an orange, soft peanut candy.  The incident occurred around 16:00 today.  Pt with ?20-30 min of coughing and O2 sat to 55% prior to EMS arrival.  Pt coughing up the candy and with immediate improved O2 sat and appearance in the presence of EMS.  No LOC.  Unknown if pt with cyanosis.  Pt denies current SOB, CP, or palpitations.  EMS reports vitals with NL O2 sat, SBP 108, and tachy to 130s.  Pt reports that he feels well at this time and does not have SOB, CP, or other complaints at this time.     Past Medical History  Diagnosis Date  . Parkinson's disease   . Hypotension   . Brain tumor     Past Surgical History  Procedure Date  . Pacemaker insertion    No family history on file.  SH: History  Substance Use Topics  . Smoking status: Not on file  . Smokeless tobacco: Not on file  . Alcohol Use: No  lives at [facility]   Review of Systems  Constitutional: Negative for fever and chills.  HENT: Negative for congestion, sore throat and rhinorrhea.   Eyes: Negative for pain and visual disturbance.  Respiratory: Positive for cough, choking and shortness of breath (resolved). Negative for wheezing.   Cardiovascular: Negative for chest pain and palpitations.  Gastrointestinal: Negative for nausea, vomiting, abdominal pain, diarrhea and constipation.    Genitourinary: Negative for dysuria and hematuria.  Musculoskeletal: Negative for back pain and gait problem.  Skin: Negative for rash and wound.  Neurological: Negative for dizziness and headaches.  Psychiatric/Behavioral: Negative for confusion and agitation.  All other systems reviewed and are negative.    Allergies  Review of patient's allergies indicates no known allergies.  Home Medications   Current Outpatient Rx  Name Route Sig Dispense Refill  . AMANTADINE HCL 100 MG PO CAPS Oral Take 100 mg by mouth daily.    Marland Kitchen CARBIDOPA-LEVODOPA 25-100 MG PO TABS Oral Take 1 tablet by mouth 4 (four) times daily.    Marland Kitchen VITAMIN D3 1000 UNITS PO TABS Oral Take 1,000 Units by mouth daily.    . CHOLESTYRAMINE 4 G PO PACK Oral Take 1 packet by mouth daily.    Marland Kitchen ENTACAPONE 200 MG PO TABS Oral Take 200 mg by mouth 3 (three) times daily.    Marland Kitchen HYPROMELLOSE 2.5 % OP SOLN Both Eyes Place 1 drop into both eyes 2 (two) times daily.    Marland Kitchen LOPERAMIDE HCL 2 MG PO CAPS Oral Take 2 mg by mouth 4 (four) times daily as needed. For loose stools.    Marland Kitchen RANITIDINE HCL 150 MG PO TABS Oral Take 150 mg by mouth 2 (two) times daily.    Marland Kitchen SACCHAROMYCES BOULARDII 250 MG PO CAPS Oral Take 250  mg by mouth daily.      BP 79/60  Pulse 99  Temp(Src) 98.6 F (37 C) (Oral)  Resp 12  SpO2 99%  Physical Exam  Nursing note and vitals reviewed. Constitutional: No distress.       Mildly slow speech, appears min fatigued but nontoxic, oriented to person, place, and year  HENT:  Head: Normocephalic and atraumatic.  Right Ear: External ear normal.  Left Ear: External ear normal.  Nose: Nose normal.       Min orange-tinged vomitus on pt's chin  Eyes: Conjunctivae and EOM are normal. Pupils are equal, round, and reactive to light.  Neck: Normal range of motion. Neck supple.  Cardiovascular: Normal rate, regular rhythm and intact distal pulses.   No murmur heard. Pulmonary/Chest: Effort normal and breath sounds normal. No  respiratory distress. He has no wheezes. He has no rales. He exhibits no tenderness.  Abdominal: Soft. Bowel sounds are normal. He exhibits no distension. There is no tenderness.  Musculoskeletal: Normal range of motion. He exhibits no edema.  Neurological: He is alert.       Shuffling gait with bent posturing  Skin: Skin is warm and dry. No rash noted. He is not diaphoretic.  Psychiatric: He has a normal mood and affect.    ED Course  Procedures (including critical care time)  Labs Reviewed - No data to display Dg Chest Portable 1 View  06/11/2011  *RADIOLOGY REPORT*  Clinical Data: Choked on candy.  Question pneumonia or lung collapse.  PORTABLE CHEST - 1 VIEW  Comparison: Portal chest 12/20/2009.  Findings: The heart size is normal.  The battery pack from a pacemaker is stable in position.  The previous left-sided PICC line has been removed.  Nodular densities projected over the left lung. These were not clearly present on prior studies.  The visualized soft tissues and bony thorax are otherwise unremarkable.  IMPRESSION:  1.  No acute cardiopulmonary disease could 2.  Potential nodular densities projected over the left lung. Recommend follow-up chest CT with contrast on a non emergent basis. If the patient is currently short breath and cannot hold his breath, this study should be deferred.  Original Report Authenticated By: Jamesetta Orleans. MATTERN, M.D.     1. Choking episode       MDM  66 y.o. M with h/o parkinson's s/p choking on soft candy with ?20-30 min of coughing and O2 sat to 55% prior to EMS arrival.  Pt coughing up the candy and with immediate improved O2 sat and appearance soon after EMS arrival.  5:39 PM:  Spoke with facility Emergency planning/management officer; OT therapist walked by the pt's room and noted the pt was coughing after eating an orange, soft peanut candies.  O2 sat noted to be 55% RA at that time.  Heimlich attempted as well as "back slaps."  No LOC; and questionable perioral  cyanosis.  Improved O2 sat with NRB.  5:46 PM:  Now talking with RN (Ramattou) who was with the pt at the time of the event.  She reports facial cyanosis.  O2 with min improvement.  Pt coughed 2 separate pieces, first with mild O2 improvement but still coughing; however, she reports complete resolution of hypoxia after the 2nd piece.  Also, pt with h/o hypotension and NL BPs = 130s-140s, 102/58 was the lowest since January.  CXR without sig path.  Although pt with BP values in 70s; this was not reproducible and pt with pulses at that time.  EKG  as noted above.  Pt well-appearing and will essentially clear lungs.  Will d/c back to his facility with plan for BP recheck tomorrow.       Particia Lather, MD 06/12/11 [phone]

## 2011-06-11 NOTE — ED Provider Notes (Signed)
I saw and evaluated the patient, reviewed the resident's note and I agree with the findings and plan. 66 year old male, with a history of severe Parkinson's disease, presents to emergency department after he had an episode of coughing.  Following his eating a piece of candy.  Apparently he became cyanotic in the circumflex oral area, and his oxygen saturations dropped into the 70s.  After coughing up.  The candy.  He returned to normal.  Presently, he is asymptomatic.  He denies recent illness He is in no respiratory distress.  His oxygen saturation is 100% with unlabored respirations and his chest x-ray does not show any infiltrates.  We will release him back to the nursing facility.  Nicholes Stairs, MD 06/11/11 2013

## 2011-06-11 NOTE — Discharge Instructions (Signed)
Return immediately if you experience a fever, persistent cough, chest pain, or other concerns.

## 2011-06-12 NOTE — ED Provider Notes (Signed)
I saw and evaluated the patient, reviewed the resident's note and I agree with the findings and plan.  Jereme Loren P Ladina Shutters, MD 06/12/11 1513 

## 2011-07-03 ENCOUNTER — Other Ambulatory Visit (HOSPITAL_COMMUNITY): Payer: Self-pay | Admitting: Internal Medicine

## 2011-07-03 DIAGNOSIS — R1312 Dysphagia, oropharyngeal phase: Secondary | ICD-10-CM

## 2011-07-14 ENCOUNTER — Ambulatory Visit (HOSPITAL_COMMUNITY)
Admission: RE | Admit: 2011-07-14 | Discharge: 2011-07-14 | Disposition: A | Discharge status: Home / Self Care | Payer: Medicare Other | Source: Ambulatory Visit | Attending: Internal Medicine | Admitting: Internal Medicine

## 2011-07-14 DIAGNOSIS — Z8673 Personal history of transient ischemic attack (TIA), and cerebral infarction without residual deficits: Secondary | ICD-10-CM | POA: Insufficient documentation

## 2011-07-14 DIAGNOSIS — G20 Parkinson's disease: Secondary | ICD-10-CM | POA: Insufficient documentation

## 2011-07-14 DIAGNOSIS — G20A1 Parkinson's disease without dyskinesia, without mention of fluctuations: Secondary | ICD-10-CM | POA: Insufficient documentation

## 2011-07-14 DIAGNOSIS — R1312 Dysphagia, oropharyngeal phase: Secondary | ICD-10-CM

## 2011-07-14 DIAGNOSIS — R1313 Dysphagia, pharyngeal phase: Secondary | ICD-10-CM | POA: Insufficient documentation

## 2011-07-14 DIAGNOSIS — R1319 Other dysphagia: Secondary | ICD-10-CM | POA: Insufficient documentation

## 2011-07-14 NOTE — Procedures (Signed)
Modified Barium Swallow Procedure Note Patient Details  Name: Steve Sloan MRN: [id number] Date of Birth: 01/10/1946  Today's Date: 07/14/2011 Time:1015  - 1110    Past Medical History:  Past Medical History  Diagnosis Date  . Parkinson's disease   . Hypotension   . Brain tumor    Past Surgical History:  Past Surgical History  Procedure Date  . Pacemaker insertion    HPI:  66 yo male referred by SNF SLP for MBS. 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  Pt PMH per SLP referral form + cough, FTT, ulcer, paralysis, pneumonitis d/t ? asp of vomitus, anemia,  orthostatic hypotension, fistula intestine and other paralytic syndrome.  Pt  has signed a release to consume po with known risk.  Per pt advocate present, pt aspirated on a circus peanut last month and "coded".  Pt also with h/o PEG placed with palliative care referral in the past due to end stage parkinsons (Aug 2011).  Pt denies recent pulmonary infections or weight loss and states he only has problems swallowing when he eats "too fast".        Recommendation/Prognosis  Clinical Impression Dysphagia Diagnosis: Severe pharyngeal phase dysphagia;Moderate cervical esophageal phase dysphagia;Moderate oral phase dysphagia Clinical impression: Pt presents with moderate oral and severe pharyngeal dysphagia characterized by decr lingual peristalsis, lingual pumping, reduced pharyngeal persistalsis, delayed swallow reflex, reducted laryngeal closure and cricopharyngeal dysfunction.  Pt demonstrated SILENT aspiration with thin during the swallow with demonstration of extended breath hold  during 2nd swallow to aid clearance of overt stasis in pharynx. - This was completed without cues-clearly compensatory. Severe stasis in pharynx with all consistencies were NOT sensed but dry  swallows aided to decr stasis *but did not eliminate it.  As testing continued, pt with worsening stasis.  Aspiration also occurred after the swallow d/t stasis.  Cued cough did not clear aspirates or penetrates.  Pt  likely has been chronically aspirating without awareness due to silent nature of dysphagia and has been tolerating.  Based on MBS results, aspiration will likely occur present with all po intake and pt may tolerate thinner consistencies better.    SLP educated pt and advocate to findings and chronicity of aspiration with risks.  Also advised pt to seek dental attention (upper nubs that appeared decayed) and to consume water with meals as able d/t pH neutral status and chronic asp.  Pt also advised to brush teeth at night, due to overt asp of secretions and advocate informed he should know heimlich manuever for use in emergent situation.  Various postures including chin tuck (only 10*) , head turn right/left, head flexed upward did not prevent asp or decr stasis.   Thinner consistencies actually may be tolerated better due to better lung tolerance. Pt expressed concern regarding pureed diet.    Swallow Evaluation Recommendations General Recommendation:  (defer to snf slp) Solid Consistency:  (defer to slp at snf) Liquid Consistency:  (defer to slp at snf) Medication Administration: Crushed with puree (or crush with liquids) Compensations: Slow rate;Small sips/bites;Multiple dry swallows after each bite/sip;Follow solids with liquid Postural Changes and/or Swallow Maneuvers: Seated upright 90 degrees (several small meals/day may be better tolerated) Follow up Recommendations: Skilled Nursing facility   Individuals Consulted Consulted and Agree with Results and Recommendations: Patient;Other (Comment) (pt advocate) Report Sent  to : Referring physician;Primary SLP;Facility (Comment) [facility])  General:  Date of Onset: 07/14/11 HPI: 66 yo male referred by SNF SLP for MBS. 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  Pt PMH per SLP referral form + cough, FTT, ulcer, paralysis, pneumonitis d/t ? asp of vomitus, anemia,  orthostatic hypotension, fistula intestine and other paralytic syndrome.  Pt  has signed a release to consume po with known risk.  Per pt advocate present, pt aspirated on a circus peanut last month and "coded".  Pt also with h/o PEG placed with palliative care referral in the past due to end stage parkinsons (Aug 2011).  Pt denies recent pulmonary infections or weight loss and states he only has problems swallowing when he eats "too fast".    Type of Study: Initial MBS Diet Prior to this Study: Dysphagia 1 (puree);Thin liquids Behavior/Cognition: Alert Oral Cavity - Dentition: Missing dentition;Poor condition (upper dentition, "nubs" only, lower intact) Oral Motor / Sensory Function: Impaired sensory;Impaired motor Oral impairment: Right lingual;Left lingual;Right facial;Left facial;Right labial;Left labial Vision: Functional for self-feeding Patient Positioning: Upright in chair Baseline Vocal Quality: Hoarse;Low vocal intensity;Breathy Volitional Cough: Weak Volitional Swallow: Unable to elicit Anatomy: Within functional limits  Reason for Referral:  Concern pt is aspirating  Oral Phase Oral Preparation/Oral Phase Oral Phase: Impaired Oral - Nectar Oral - Nectar Cup: Reduced posterior propulsion;Piecemeal swallowing;Weak lingual manipulation;Lingual pumping;Delayed oral transit Oral - Thin Oral - Thin Teaspoon: Reduced posterior propulsion;Piecemeal swallowing;Weak lingual manipulation;Lingual pumping;Delayed oral transit Oral - Thin Cup: Reduced posterior propulsion;Piecemeal swallowing;Weak lingual manipulation;Lingual pumping;Delayed oral transit Oral - Thin Straw: Reduced posterior  propulsion;Piecemeal swallowing;Weak lingual manipulation;Lingual pumping;Delayed oral transit Oral - Solids Oral - Puree: Reduced posterior propulsion;Piecemeal swallowing;Weak lingual manipulation;Lingual pumping;Delayed oral transit Oral - Regular: Weak lingual manipulation;Impaired mastication;Reduced posterior propulsion;Lingual pumping;Delayed oral transit;Piecemeal swallowing Oral Phase - Comment Oral Phase - Comment: Abnormal lingual peristalsis Pharyngeal Phase  Pharyngeal Phase Pharyngeal Phase: Impaired Pharyngeal - Nectar Pharyngeal - Nectar Cup: Delayed swallow initiation;Premature spillage to pyriform;Reduced epiglottic inversion;Reduced tongue base retraction;Reduced anterior laryngeal mobility;Reduced laryngeal elevation;Reduced pharyngeal peristalsis;Reduced airway/laryngeal closure Pharyngeal - Thin Pharyngeal - Thin Teaspoon: Delayed swallow initiation;Premature spillage to pyriform;Reduced epiglottic inversion;Reduced tongue base retraction;Reduced anterior laryngeal mobility;Reduced laryngeal elevation;Penetration/Aspiration before swallow;Penetration/Aspiration during swallow;Reduced airway/laryngeal closure;Reduced pharyngeal peristalsis;Pharyngeal residue - cp segment Penetration/Aspiration details (thin teaspoon): Material enters airway, remains ABOVE vocal cords and not ejected out Pharyngeal - Thin Cup: Delayed swallow initiation;Premature spillage to pyriform;Reduced laryngeal elevation;Reduced tongue base retraction;Reduced epiglottic inversion;Reduced anterior laryngeal mobility;Reduced pharyngeal peristalsis;Reduced airway/laryngeal closure;Moderate aspiration;Penetration/Aspiration after swallow;Penetration/Aspiration during swallow;Pharyngeal residue - cp segment;Pharyngeal residue - valleculae Penetration/Aspiration details (thin cup): Material enters airway, passes BELOW cords without attempt by patient to eject out (silent aspiration) Pharyngeal - Thin Straw:  Delayed swallow initiation;Premature spillage to pyriform;Reduced pharyngeal peristalsis;Reduced airway/laryngeal closure;Reduced laryngeal elevation;Reduced tongue base retraction;Reduced epiglottic inversion;Reduced anterior laryngeal mobility;Moderate aspiration;Penetration/Aspiration during swallow;Pharyngeal residue - cp segment;Pharyngeal residue - valleculae Penetration/Aspiration details (thin straw): Material enters airway, passes BELOW cords without attempt by patient to eject out (silent aspiration) Pharyngeal - Solids Pharyngeal - Puree: Delayed swallow initiation;Reduced pharyngeal peristalsis;Reduced epiglottic inversion;Reduced anterior laryngeal mobility;Reduced laryngeal elevation;Premature spillage to valleculae;Reduced tongue base retraction;Reduced airway/laryngeal closure;Penetration/Aspiration after swallow;Pharyngeal residue - cp segment;Pharyngeal residue - valleculae Penetration/Aspiration details (puree): Material enters airway, remains ABOVE vocal cords and not ejected out Pharyngeal - Regular: Delayed swallow  initiation;Premature spillage to valleculae;Reduced pharyngeal peristalsis;Reduced airway/laryngeal closure;Reduced epiglottic inversion;Reduced tongue base retraction;Reduced anterior laryngeal mobility;Reduced laryngeal elevation Pharyngeal Phase - Comment Pharyngeal Comment: chin tuck (only down 10*), head turn right or left, head flexed upward did not prevent aspiration/penetration or decr stasis, dry swallows helped to decr stasis but did not eliminate it and pt has no sensation Cervical Esophageal Phase  Cervical Esophageal Phase Cervical Esophageal Phase: Impaired Cervical Esophageal Phase - Nectar Nectar Cup: Reduced cricopharyngeal relaxation Cervical Esophageal Phase - Thin Thin Teaspoon: Reduced cricopharyngeal relaxation Thin Cup: Reduced cricopharyngeal relaxation Thin Straw: Reduced cricopharyngeal relaxation Cervical Esophageal Phase - Solids Puree:  Reduced cricopharyngeal relaxation Regular: Reduced cricopharyngeal relaxation Cervical Esophageal Phase - Comment Cervical Esophageal Comment: dry swallows decr residuals     Chales Abrahams 07/14/2011, 11:56 AM

## 2012-03-11 ENCOUNTER — Other Ambulatory Visit: Payer: Self-pay | Admitting: Surgery

## 2012-04-08 ENCOUNTER — Ambulatory Visit: Payer: Medicare Other | Admitting: Internal Medicine

## 2012-05-02 ENCOUNTER — Encounter: Payer: Self-pay | Admitting: Internal Medicine

## 2012-05-04 ENCOUNTER — Telehealth: Payer: Self-pay | Admitting: Gastroenterology

## 2012-05-04 ENCOUNTER — Encounter: Payer: Self-pay | Admitting: Internal Medicine

## 2012-05-04 ENCOUNTER — Ambulatory Visit (INDEPENDENT_AMBULATORY_CARE_PROVIDER_SITE_OTHER): Payer: Medicare Other | Admitting: Internal Medicine

## 2012-05-04 ENCOUNTER — Other Ambulatory Visit (INDEPENDENT_AMBULATORY_CARE_PROVIDER_SITE_OTHER): Payer: Medicare Other

## 2012-05-04 VITALS — BP 120/70 | HR 72 | Ht 71.0 in | Wt 172.4 lb

## 2012-05-04 DIAGNOSIS — Z9049 Acquired absence of other specified parts of digestive tract: Secondary | ICD-10-CM | POA: Insufficient documentation

## 2012-05-04 DIAGNOSIS — K562 Volvulus: Secondary | ICD-10-CM | POA: Insufficient documentation

## 2012-05-04 DIAGNOSIS — D649 Anemia, unspecified: Secondary | ICD-10-CM

## 2012-05-04 DIAGNOSIS — G20 Parkinson's disease: Secondary | ICD-10-CM

## 2012-05-04 DIAGNOSIS — R197 Diarrhea, unspecified: Secondary | ICD-10-CM

## 2012-05-04 DIAGNOSIS — Z9889 Other specified postprocedural states: Secondary | ICD-10-CM

## 2012-05-04 LAB — CBC WITH DIFFERENTIAL/PLATELET
Basophils Absolute: 0 10*3/uL (ref 0.0–0.1)
HCT: 30.2 % — ABNORMAL LOW (ref 39.0–52.0)
Lymphs Abs: 1.1 10*3/uL (ref 0.7–4.0)
MCV: 90.9 fl (ref 78.0–100.0)
Monocytes Absolute: 0.3 10*3/uL (ref 0.1–1.0)
Platelets: 158 10*3/uL (ref 150.0–400.0)
RDW: 16.3 % — ABNORMAL HIGH (ref 11.5–14.6)

## 2012-05-04 LAB — IBC PANEL: Iron: 22 ug/dL — ABNORMAL LOW (ref 42–165)

## 2012-05-04 LAB — COMPREHENSIVE METABOLIC PANEL
AST: 15 U/L (ref 0–37)
BUN: 26 mg/dL — ABNORMAL HIGH (ref 6–23)
Calcium: 9 mg/dL (ref 8.4–10.5)
Chloride: 109 mEq/L (ref 96–112)
Creatinine, Ser: 0.9 mg/dL (ref 0.4–1.5)
GFR: 94.1 mL/min (ref >60.00)
Total Bilirubin: 0.4 mg/dL (ref 0.3–1.2)

## 2012-05-04 LAB — IGA: IgA: 315 mg/dL (ref 68–378)

## 2012-05-04 NOTE — Patient Instructions (Addendum)
Your physician has requested that you go to the basement for the following lab work before leaving today: CBC, CMP, Celiac Panel, Iron Studies  Stay on current doses of Loperamide, Lomotil, florastor, and Questran.

## 2012-05-04 NOTE — Telephone Encounter (Signed)
Faxed office note to Dr. Leanord Hawking at [facility]

## 2012-05-04 NOTE — Progress Notes (Signed)
Patient ID: Steve Sloan, male   DOB: [birth year], 67 y.o.   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 Leanord Hawking for evaluation of diarrhea and reported heme positive stool.  The patient is accompanied today by healthcare aid from his skilled nursing facility, [facility].  Patient reports he's had diarrhea for the last 2 years ever since his bowel surgery. He reports this was severe for about 18 months but over the last 4-6 months has been improved. He is taking antidiarrheals. He denies abdominal pain. He has not seen any blood in his stool or melena. He reports he's had a good appetite without nausea and vomiting. He reports that he is swallowing "fine".  Regarding his bowel habits he said initially he was having uncontrollable stooling, but now he is usually in control of his bowel movements. Occasionally she does have fecal incontinence and wears an adult diaper. He reports he was having 7-8 stools per day but this is now down to 2-3 stools per day.  On further talking he was unaware of which bowel surgery he had, and did not seem to be aware that his entire colon had been removed.  On review of his MAR he is taking loperamide 4 mg twice daily, Questran 4 g twice daily, Lomotil 2 tablets twice daily, Florastor twice daily and iron 325 mg twice daily   Review of Systems  As per history of present illness, otherwise negative   Past Medical History  Diagnosis Date  . Parkinson's disease   . Hypotension   . Brain tumor   . Encounter for intubation   . Dysphagia   . Enterocutaneous fistula   . Chronic  diarrhea   . Anemia   . Anal fistula     Current Outpatient Prescriptions  Medication Sig Dispense Refill  . amantadine (SYMMETREL) 100 MG capsule Take 100 mg by mouth daily.      . carbidopa-levodopa (SINEMET) 25-100 MG per tablet Take 1 tablet by mouth 4 (four) times daily.      . cholecalciferol (VITAMIN D) 1000 UNITS tablet Take 1,000 Units by mouth daily.      . cholestyramine (QUESTRAN) 4 G packet Take 1 packet by mouth daily.      . diphenoxylate-atropine (LOMOTIL) 2.5-0.025 MG per tablet Take 1 tablet by mouth 2 (two) times daily.      Marland Kitchen doxycycline (VIBRAMYCIN) 50 MG/5ML SYRP Take 50 mg by mouth every 12 (twelve) hours.      . entacapone (COMTAN) 200 MG tablet Take 200 mg by mouth 3 (three) times daily.      . ferrous sulfate 325 (65 FE) MG tablet Take 325 mg by mouth daily with breakfast.      . hydroxypropyl methylcellulose (ISOPTO TEARS) 2.5 % ophthalmic solution Place 1 drop into both eyes 2 (two) times daily.      Marland Kitchen loperamide (IMODIUM) 2 MG capsule Take 2 mg by mouth 4 (four) times daily as needed. For loose stools.      . ranitidine (ZANTAC) 150 MG tablet Take 150 mg by mouth 2 (two) times daily.      Marland Kitchen saccharomyces boulardii (FLORASTOR) 250 MG capsule Take 250  mg by mouth daily.      Marland Kitchen tobramycin-dexamethasone (TOBRADEX) ophthalmic ointment Place 1 application into both eyes 3 (three) times daily.        No Known Allergies  History reviewed. No pertinent family history.  History  Substance Use Topics  . Smoking status: Never Smoker   . Smokeless tobacco: Never Used  . Alcohol Use: No    OBJECTIVE: BP 120/70  Pulse 72  Ht 5\' 11"  (1.803 m)  Wt 172 lb 6.4 oz (78.2 kg)  BMI 24.04 kg/m2 Constitutional: Chronically ill-appearing male sitting in a wheelchair in no acute distress, appearing older than stated age HEENT: Normocephalic and atraumatic. Oropharynx is clear and moist.  No scleral icterus. Cardiovascular: Normal rate, regular rhythm and intact distal  pulses. No M/R/G Pulmonary/chest: Clear bilaterally Abdominal: Soft, nontender, nondistended. Bowel sounds active throughout. Well-healed surgical scars Extremities: no clubbing, cyanosis, or edema Neurological: Moving all 4 extremities, speech is slow but intelligible, oriented to person place and time. Skin: Skin is warm and dry. Psychiatric: Normal mood and flat affect.   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 Leanord Hawking for evaluation of diarrhea and reported heme positive stool.    1.  Diarrhea -- the patient has had a total colectomy and would not be expected to have formed stools given the lack of the colon.  I would expect for him to have loose stools occurring 4-5 times per day after total colectomy. It seems that he has had improved response and control of his bowel movements with the addition of antidiarrheals. At this point he is having 2-3 bowel movements per day, and he is usually in control of defecation, which seems ideal given the circumstances.  With this in mind I recommended that he continue with scheduled loperamide, Lomotil, Questran, and Florastor at the current doses. He had a borderline celiac panel, and I will repeat this today. I also am checking blood counts and iron stores again. I spent a great deal of time explaining to him his bowel surgery and what is expected for  someone that has had a colectomy with ilio rectal anastomosis.  2.  Heme positive stool -- the patient has not had overt bleeding, and most recently there are notes indicating his hemoglobin has improved. I am rechecking CBC and iron stores today. In the absence of overt bleeding given his severe Parkinson's disease, I do not think he needs to procedure. Of course, if overt bleeding or profound anemia occurs, this decision can be revisited. EGD would likely be the first procedure ordered, should this be necessary given his prior colectomy.  He understands this recommendation and is in agreement. He did have a borderline celiac panel and I will repeat this today.  This to be faxed to Dr. Leanord Hawking and Steve Cargo, NP-C for their review. They're asked to call us with any specific questions or concerns.

## 2012-05-05 LAB — FERRITIN: Ferritin: 25.9 ng/mL (ref 22.0–322.0)

## 2012-05-09 ENCOUNTER — Other Ambulatory Visit: Payer: Self-pay

## 2012-05-09 DIAGNOSIS — K900 Celiac disease: Secondary | ICD-10-CM

## 2012-05-20 IMAGING — CT CT ABD-PELV W/ CM
2 of 5 series · 16 of 46 positions shown, 18 images · IV contrast (Omnipaque 300)
Comparison: Radiography same day

CLINICAL DATA: Abdominal pain.  Abnormal radiography.

CT ABDOMEN AND PELVIS WITH CONTRAST
TECHNIQUE: Multidetector CT imaging of the abdomen and pelvis was
performed following the standard protocol during bolus
administration of intravenous contrast.
Contrast: 100 ml [id number]-9TT

[Series 2: abd_pel_with 5.0 b40f · axial · 0.78mm/px · z∈[-492,-72]mm · 13 of 100 slices shown, 15 images]
[im 8/100  soft-tissue]
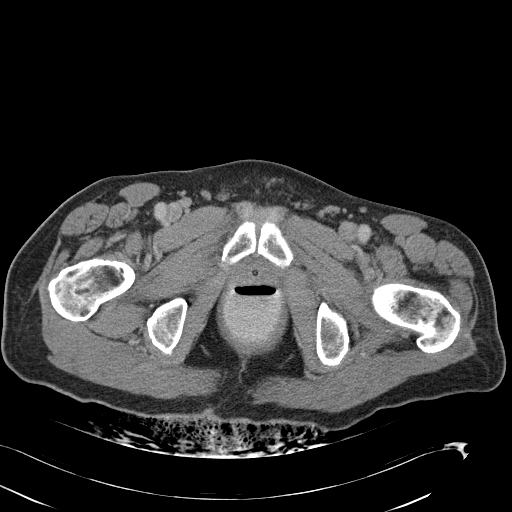
[im 8/100  bone]
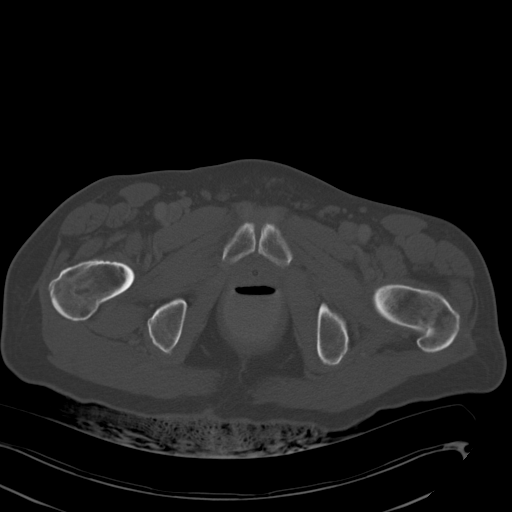
[im 15/100  soft-tissue]
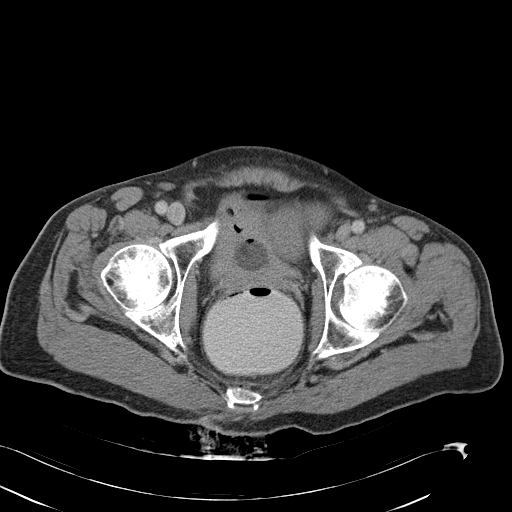
[im 22/100  soft-tissue]
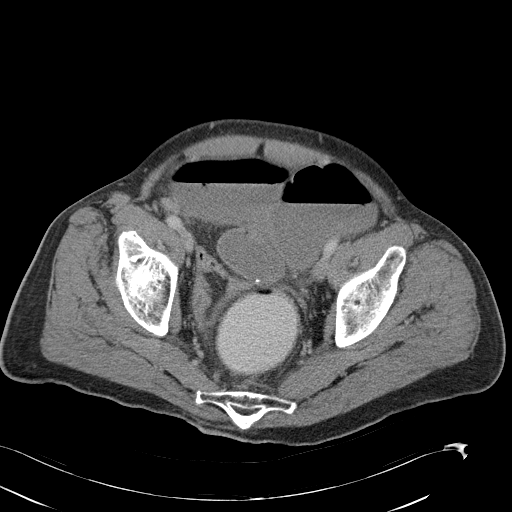
[im 29/100  soft-tissue]
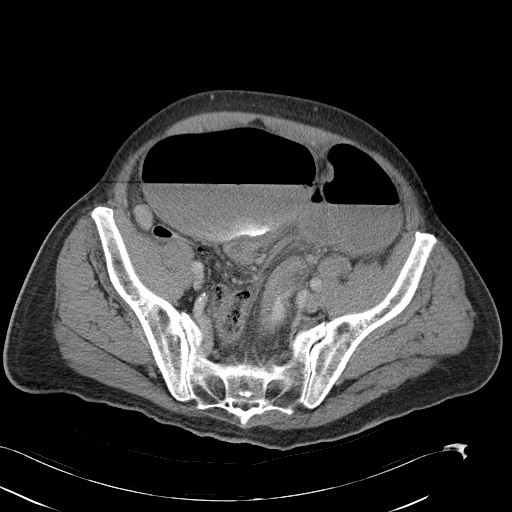
[im 36/100  soft-tissue]
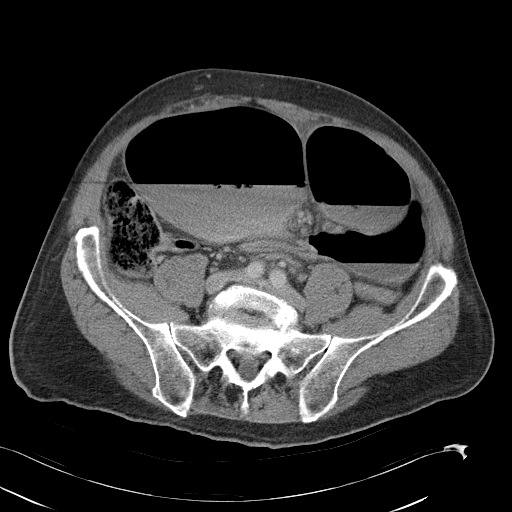
[im 43/100  soft-tissue]
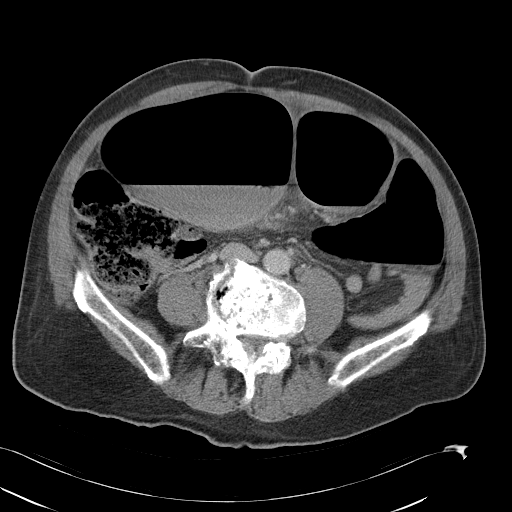
[im 50/100  soft-tissue]
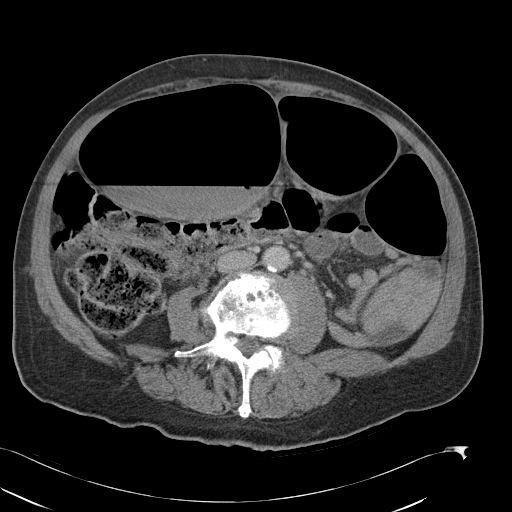
[im 57/100  soft-tissue]
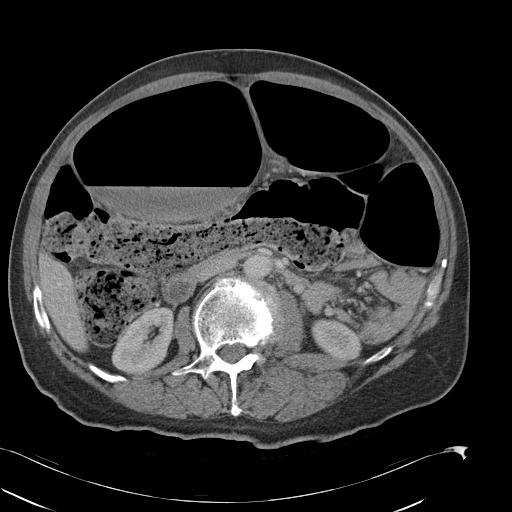
[im 64/100  soft-tissue]
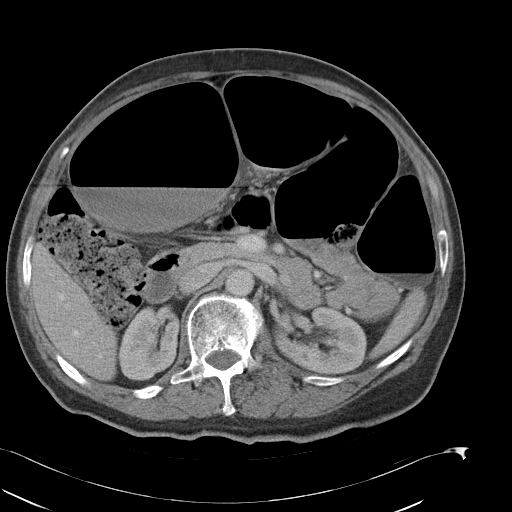
[im 64/100  bone]
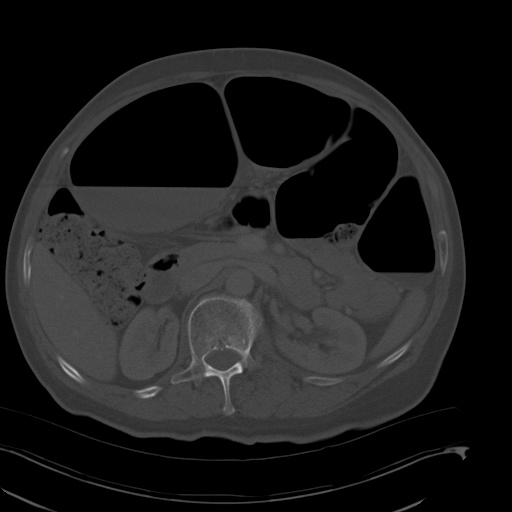
[im 71/100  soft-tissue]
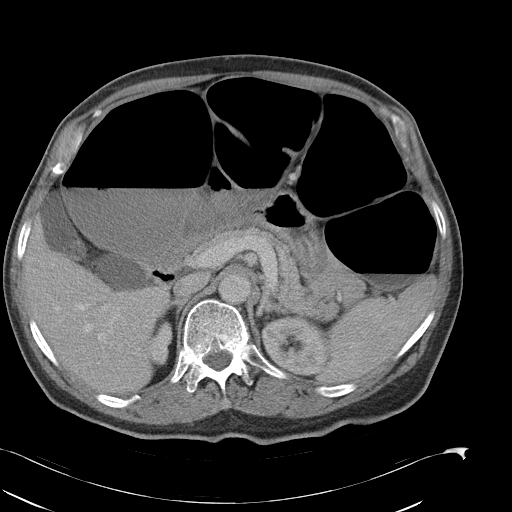
[im 78/100  soft-tissue]
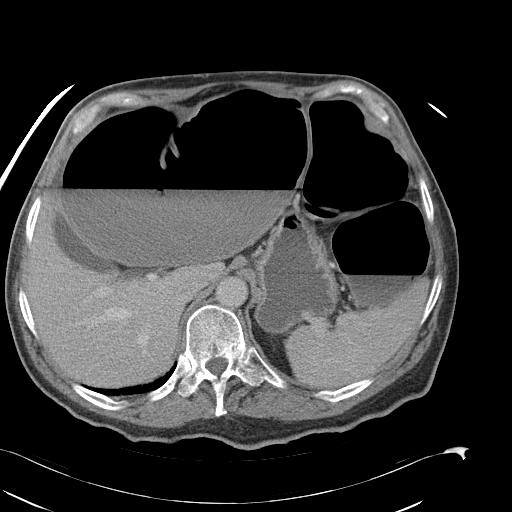
[im 85/100  soft-tissue]
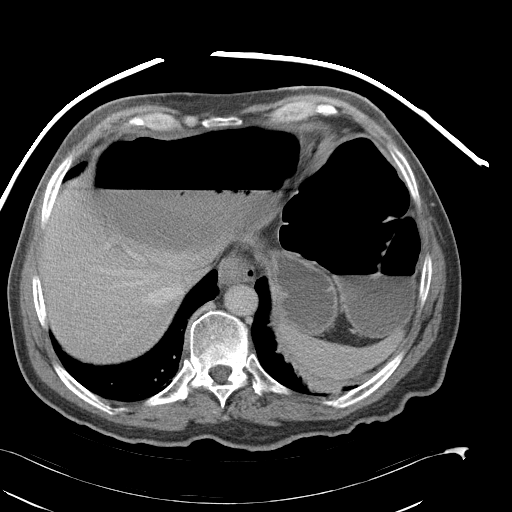
[im 92/100  soft-tissue]
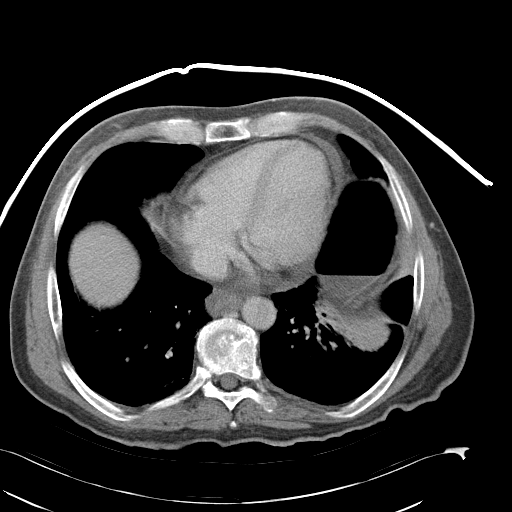

[Series 4: abd_pel_with 3.0 spo cor · coronal · 0.75mm/px · 3 of 98 slices shown]
[im 33/98  soft-tissue]
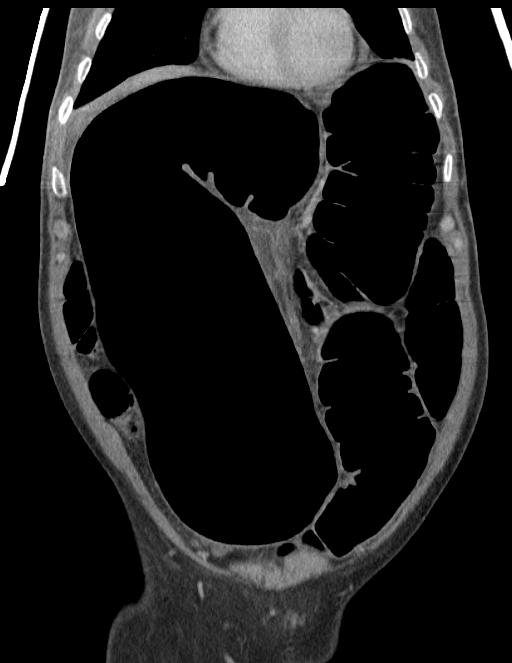
[im 44/98  soft-tissue]
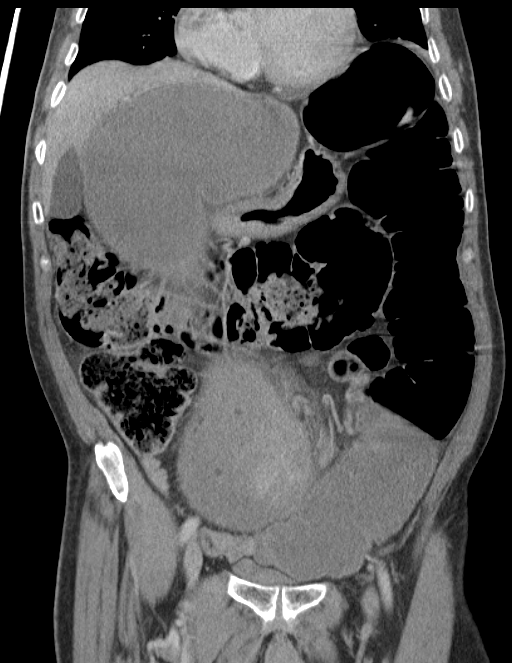
[im 54/98  soft-tissue]
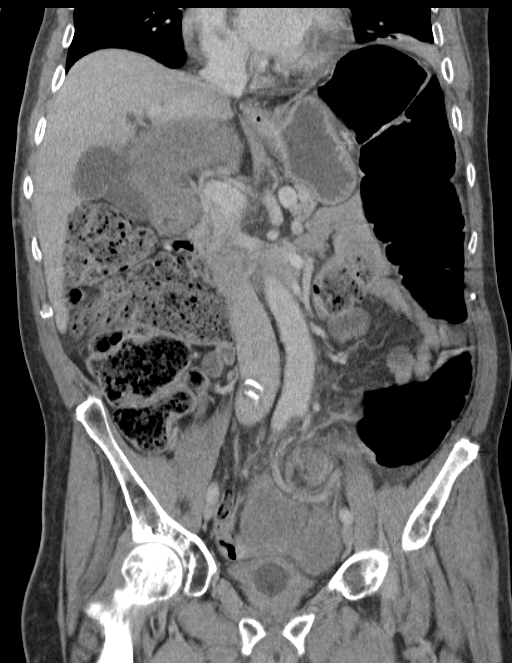

[16 of 46 positions shown; findings below may reference images not displayed]

FINDINGS: The hemidiaphragms are elevated, more on the left, with
mild basilar atelectasis.  There is a small amount of pericardial
fluid.

The liver, gallbladder, spleen, pancreas, adrenal glands and
kidneys are unremarkable.  The aorta and IVC are unremarkable.
There is a catheter in the bladder.

Rectal contrast was administered.  There is a definite sigmoid
volvulus.  I believe there is complete 360 degrees twisting.  There
is relative obstruction proximal to that.  Small bowel is not
dilated.  No free air.

There is pronounced scoliosis and degenerative disease of the
spine.
IMPRESSION: Definite sigmoid volvulus with 360 degree twist.  No perforation.

Critical test results telephoned to Dr. Rochedi at the time of
interpretation on [DATE] at 9007 hours.

## 2012-06-16 ENCOUNTER — Emergency Department (HOSPITAL_COMMUNITY)
Admission: EM | Admit: 2012-06-16 | Discharge: 2012-06-16 | Disposition: A | Discharge status: Home / Self Care | Payer: Medicare Other | Attending: Emergency Medicine | Admitting: Emergency Medicine

## 2012-06-16 ENCOUNTER — Emergency Department (HOSPITAL_COMMUNITY): Payer: Medicare Other

## 2012-06-16 ENCOUNTER — Encounter (HOSPITAL_COMMUNITY): Payer: Self-pay

## 2012-06-16 DIAGNOSIS — S0180XA Unspecified open wound of other part of head, initial encounter: Secondary | ICD-10-CM | POA: Insufficient documentation

## 2012-06-16 DIAGNOSIS — Z79899 Other long term (current) drug therapy: Secondary | ICD-10-CM | POA: Insufficient documentation

## 2012-06-16 DIAGNOSIS — Y939 Activity, unspecified: Secondary | ICD-10-CM | POA: Insufficient documentation

## 2012-06-16 DIAGNOSIS — Z8719 Personal history of other diseases of the digestive system: Secondary | ICD-10-CM | POA: Insufficient documentation

## 2012-06-16 DIAGNOSIS — Z01818 Encounter for other preprocedural examination: Secondary | ICD-10-CM | POA: Insufficient documentation

## 2012-06-16 DIAGNOSIS — Z9861 Coronary angioplasty status: Secondary | ICD-10-CM | POA: Insufficient documentation

## 2012-06-16 DIAGNOSIS — R197 Diarrhea, unspecified: Secondary | ICD-10-CM | POA: Insufficient documentation

## 2012-06-16 DIAGNOSIS — S0190XA Unspecified open wound of unspecified part of head, initial encounter: Secondary | ICD-10-CM | POA: Insufficient documentation

## 2012-06-16 DIAGNOSIS — Z9181 History of falling: Secondary | ICD-10-CM | POA: Insufficient documentation

## 2012-06-16 DIAGNOSIS — Z86011 Personal history of benign neoplasm of the brain: Secondary | ICD-10-CM | POA: Insufficient documentation

## 2012-06-16 DIAGNOSIS — W19XXXA Unspecified fall, initial encounter: Secondary | ICD-10-CM

## 2012-06-16 DIAGNOSIS — S0280XA Fracture of other specified skull and facial bones, unspecified side, initial encounter for closed fracture: Secondary | ICD-10-CM | POA: Insufficient documentation

## 2012-06-16 DIAGNOSIS — D649 Anemia, unspecified: Secondary | ICD-10-CM | POA: Insufficient documentation

## 2012-06-16 DIAGNOSIS — Z23 Encounter for immunization: Secondary | ICD-10-CM | POA: Insufficient documentation

## 2012-06-16 DIAGNOSIS — IMO0002 Reserved for concepts with insufficient information to code with codable children: Secondary | ICD-10-CM

## 2012-06-16 DIAGNOSIS — S0292XA Unspecified fracture of facial bones, initial encounter for closed fracture: Secondary | ICD-10-CM

## 2012-06-16 DIAGNOSIS — Z8679 Personal history of other diseases of the circulatory system: Secondary | ICD-10-CM | POA: Insufficient documentation

## 2012-06-16 DIAGNOSIS — R296 Repeated falls: Secondary | ICD-10-CM | POA: Insufficient documentation

## 2012-06-16 DIAGNOSIS — G20 Parkinson's disease: Secondary | ICD-10-CM | POA: Insufficient documentation

## 2012-06-16 DIAGNOSIS — G20A1 Parkinson's disease without dyskinesia, without mention of fluctuations: Secondary | ICD-10-CM | POA: Insufficient documentation

## 2012-06-16 DIAGNOSIS — S61409A Unspecified open wound of unspecified hand, initial encounter: Secondary | ICD-10-CM | POA: Insufficient documentation

## 2012-06-16 DIAGNOSIS — Y9210 Unspecified residential institution as the place of occurrence of the external cause: Secondary | ICD-10-CM | POA: Insufficient documentation

## 2012-06-16 MED ORDER — CEPHALEXIN 500 MG PO CAPS: 500.0000 mg | ORAL_CAPSULE | Freq: Four times a day (QID) | ORAL | Status: DC

## 2012-06-16 MED ORDER — TETANUS-DIPHTH-ACELL PERTUSSIS 5-2.5-18.5 LF-MCG/0.5 IM SUSP
0.5000 mL | Freq: Once | INTRAMUSCULAR | Status: AC
  Administered 2012-06-16: 0.5 mL via INTRAMUSCULAR
  Filled 2012-06-16: qty 0.5

## 2012-06-16 MED ORDER — CEFAZOLIN SODIUM 1 G IJ SOLR
1.0000 g | Freq: Once | INTRAMUSCULAR | Status: AC
  Administered 2012-06-16: 1 g via INTRAMUSCULAR
  Filled 2012-06-16: qty 10

## 2012-06-16 NOTE — ED Notes (Signed)
Pt on LSB and with cervical collar.  LSB removed by RN staff.  Spine cleared for injury.

## 2012-06-16 NOTE — ED Notes (Signed)
ZOX:[id number]<AV> Expected date:<BR> Expected time:<BR> Means of arrival:<BR> Comments:<BR> EMS from facility-fall-laceration left eyebrow area/confused

## 2012-06-16 NOTE — ED Provider Notes (Signed)
History     CSN: [id number]  Arrival date & time 06/16/12  6213   First MD Initiated Contact with Patient 06/16/12 0327      Chief Complaint  Patient presents with  . Fall    (Consider location/radiation/quality/duration/timing/severity/associated sxs/prior treatment) HPI History provided by EMS, nursing home record and patient. Resides in a nursing home past medical history of Parkinson's. Unwitnessed fall- staff heard her fall the evaluation was found to have head lac, chin lack and skin tear right hand. Patient has history of frequent falls. Unknown LOC. No neck pain. No chest pain, abdominal pain or difficulty breathing. has mild to moderate pain 2 lacerations, declines any pain medicines. PT is a limited historian secondary Parkinson's  Past Medical History  Diagnosis Date  . Parkinson's disease   . Hypotension   . Brain tumor   . Encounter for intubation   . Dysphagia   . Enterocutaneous fistula   . Chronic diarrhea   . Anemia   . Anal fistula     Past Surgical History  Procedure Laterality Date  . Pacemaker insertion    . Deep brain stimulator placement      with removal of brain cells  . Colectomy      History reviewed. No pertinent family history.  History  Substance Use Topics  . Smoking status: Never Smoker   . Smokeless tobacco: Never Used  . Alcohol Use: No      Review of Systems  Constitutional: Negative for fever and chills.  HENT: Negative for neck pain and neck stiffness.   Eyes: Negative for visual disturbance.  Respiratory: Negative for shortness of breath.   Cardiovascular: Negative for chest pain.  Gastrointestinal: Negative for abdominal pain.  Genitourinary: Negative for dysuria and flank pain.  Musculoskeletal: Negative for back pain.  Skin: Positive for wound. Negative for rash.  Neurological: Negative for headaches.  All other systems reviewed and are negative.    Allergies  Review of patient's allergies indicates no known  allergies.  Home Medications   Current Outpatient Rx  Name  Route  Sig  Dispense  Refill  . amantadine (SYMMETREL) 100 MG capsule   Oral   Take 100 mg by mouth daily.         . carbidopa-levodopa (SINEMET) 25-100 MG per tablet   Oral   Take 1 tablet by mouth 4 (four) times daily.         . cholestyramine (QUESTRAN) 4 G packet   Oral   Take 1 packet by mouth daily.         Marland Kitchen doxycycline (MONODOX) 50 MG capsule   Oral   Take 50 mg by mouth 2 (two) times daily.         . ferrous sulfate 325 (65 FE) MG tablet   Oral   Take 325 mg by mouth 2 (two) times daily with a meal.          . hydroxypropyl methylcellulose (ISOPTO TEARS) 2.5 % ophthalmic solution   Both Eyes   Place 1 drop into both eyes 2 (two) times daily.         Marland Kitchen loperamide (IMODIUM) 2 MG capsule   Oral   Take 2 mg by mouth 4 (four) times daily as needed. For loose stools.         Marland Kitchen QUEtiapine (SEROQUEL) 25 MG tablet   Oral   Take 25 mg by mouth at bedtime.         . saccharomyces boulardii (FLORASTOR)  250 MG capsule   Oral   Take 250 mg by mouth daily.         Marland Kitchen tobramycin-dexamethasone (TOBRADEX) ophthalmic ointment   Both Eyes   Place 1 application into both eyes 2 (two) times daily.         . Vitamin D, Ergocalciferol, (DRISDOL) 50000 UNITS CAPS   Oral   Take 50,000 Units by mouth every 30 (thirty) days.           BP 118/60  Pulse 69  Temp(Src) 97.5 F (36.4 C) (Oral)  Resp 17  Ht 5\' 10"  (1.778 m)  Wt 173 lb (78.472 kg)  BMI 24.82 kg/m2  SpO2 100%  Physical Exam  Constitutional: He appears well-developed and well-nourished.  HENT:  Head: Normocephalic.  2 cm curvilinear laceration just lateral to left eye with post operative skin changes. No entrapment with extraocular movements intact. There is some mild associated tenderness without obvious bony deformity. No epistaxis or nasal deformity. There is a 1 cm lack to the chin without any bony tenderness or deformity. No  trismus. No dental tenderness.  Eyes: EOM are normal. Pupils are equal, round, and reactive to light.  Neck: Neck supple.  No midline cervical tenderness or deformity. Cervical collar in place.  Cardiovascular: Normal rate, regular rhythm and intact distal pulses.   Pulmonary/Chest: Effort normal and breath sounds normal. No respiratory distress. He exhibits no tenderness.  Abdominal: Soft. He exhibits no distension. There is no tenderness.  Musculoskeletal: Normal range of motion. He exhibits no edema.  Pelvis stable. Distal neurovascular intact x4. No lower extremity deformity. Right hand with skin tear over MCP of the fifth digit with mild tenderness and no obvious deformity.  Neurological:  Awake, alert and oriented  Skin: Skin is warm and dry.    ED Course  LACERATION REPAIR Date/Time: 06/16/2012 5:48 AM Performed by: Sunnie Nielsen Authorized by: Sunnie Nielsen Consent: Verbal consent obtained. Risks and benefits: risks, benefits and alternatives were discussed Consent given by: patient Patient understanding: patient states understanding of the procedure being performed Patient consent: the patient's understanding of the procedure matches consent given Procedure consent: procedure consent matches procedure scheduled Required items: required blood products, implants, devices, and special equipment available Patient identity confirmed: verbally with patient Time out: Immediately prior to procedure a "time out" was called to verify the correct patient, procedure, equipment, support staff and site/side marked as required. Body area: head/neck (Lateral to left eye) Laceration length: 2 cm Tendon involvement: none Nerve involvement: none Vascular damage: no Preparation: Patient was prepped and draped in the usual sterile fashion. Irrigation solution: saline Irrigation method: syringe Amount of cleaning: extensive Skin closure: glue Technique: simple Approximation:  close Approximation difficulty: simple Patient tolerance: Patient tolerated the procedure well with no immediate complications.   LACERATION REPAIR Performed by: Sunnie Nielsen Authorized by: Sunnie Nielsen Consent: Verbal consent obtained. Risks and benefits: risks, benefits and alternatives were discussed Consent given by: patient Patient identity confirmed: provided demographic data Prepped and Draped in normal sterile fashion Wound explored  Laceration Location: Chin  Laceration Length: 1cm  No Foreign Bodies seen or palpated  Local anesthetic: None   Irrigation method: syringe Amount of cleaning: standard  Skin closure: Dermabond   Close approximation of wound edges   Patient tolerance: Patient tolerated the procedure well with no immediate complications.   Labs Reviewed - No data to display Ct Head Wo Contrast  06/16/2012  *RADIOLOGY REPORT*  Clinical Data:  Parkinson's, brain tumor, fall.  CT HEAD  WITHOUT CONTRAST CT CERVICAL SPINE WITHOUT CONTRAST  Technique:  Multidetector CT imaging of the head and cervical spine was performed following the standard protocol without intravenous contrast.  Multiplanar CT image reconstructions of the cervical spine were also generated.  Comparison:  09/15/2010  CT HEAD  Findings: Right trans frontal approach neural stimulator, with unchanged tip position adjacent to the right thalamus/midbrain. Periventricular and subcortical white matter hypodensities are most in keeping with chronic microangiopathic change. More focal basilar ganglia/centrum semiovale white matter hypodensities are similar to prior may reflect a lacunar infarctions. There is no evidence for acute hemorrhage, hydrocephalus, mass lesion, or abnormal extra- axial fluid collection.  No definite CT evidence for acute infarction.  There is a comminuted fracture of the left maxillary sinus anterior and lateral walls. Nondisplaced fracture of the left orbital floor. There are a couple  locules of gas along the orbital floor extracoronal fat.  Blood collects within the left maxillary sinus. No displaced calvarial fracture.  IMPRESSION: Volume loss and white matter changes are similar to prior.  Unchanged right trans frontal approach neural stimulator.  Comminuted fractures of the anterior and lateral walls of the left maxillary sinus.  Nondisplaced left orbital floor fracture.  Discussed via telephone with Dr. Dierdre Highman at 05:28 a.m. on 06/16/2012.  CT CERVICAL SPINE  Findings: The lung apices are clear.  Maintained craniocervical relationship.  No dens fracture.  Maintained vertebral body height. Osseous fusion at C4-5 and C5-6, similar to prior.  Grade 1 anterolisthesis of C3 on C4 and C7 on T1, similar to prior. Small C5 hemangioma.  Paravertebral soft tissues within normal limits.  IMPRESSION: Multilevel degenerative changes without acute osseous finding of the cervical spine.   Original Report Authenticated By: Jearld Lesch, M.D.    Ct Cervical Spine Wo Contrast  06/16/2012  *RADIOLOGY REPORT*  Clinical Data:  Parkinson's, brain tumor, fall.  CT HEAD WITHOUT CONTRAST CT CERVICAL SPINE WITHOUT CONTRAST  Technique:  Multidetector CT imaging of the head and cervical spine was performed following the standard protocol without intravenous contrast.  Multiplanar CT image reconstructions of the cervical spine were also generated.  Comparison:  09/15/2010  CT HEAD  Findings: Right trans frontal approach neural stimulator, with unchanged tip position adjacent to the right thalamus/midbrain. Periventricular and subcortical white matter hypodensities are most in keeping with chronic microangiopathic change. More focal basilar ganglia/centrum semiovale white matter hypodensities are similar to prior may reflect a lacunar infarctions. There is no evidence for acute hemorrhage, hydrocephalus, mass lesion, or abnormal extra- axial fluid collection.  No definite CT evidence for acute infarction.  There is  a comminuted fracture of the left maxillary sinus anterior and lateral walls. Nondisplaced fracture of the left orbital floor. There are a couple locules of gas along the orbital floor extracoronal fat.  Blood collects within the left maxillary sinus. No displaced calvarial fracture.  IMPRESSION: Volume loss and white matter changes are similar to prior.  Unchanged right trans frontal approach neural stimulator.  Comminuted fractures of the anterior and lateral walls of the left maxillary sinus.  Nondisplaced left orbital floor fracture.  Discussed via telephone with Dr. Dierdre Highman at 05:28 a.m. on 06/16/2012.  CT CERVICAL SPINE  Findings: The lung apices are clear.  Maintained craniocervical relationship.  No dens fracture.  Maintained vertebral body height. Osseous fusion at C4-5 and C5-6, similar to prior.  Grade 1 anterolisthesis of C3 on C4 and C7 on T1, similar to prior. Small C5 hemangioma.  Paravertebral soft tissues within normal  limits.  IMPRESSION: Multilevel degenerative changes without acute osseous finding of the cervical spine.   Original Report Authenticated By: Jearld Lesch, M.D.    Dg Hand Complete Right  06/16/2012  *RADIOLOGY REPORT*  Clinical Data: Fall, hand abrasion.  RIGHT HAND - COMPLETE 3+ VIEW  Comparison: None.  Findings: Osteopenia.  Screws within the third and fourth metacarpals.  On the lateral view, the head of some of the screws noted to be dorsal to the cortex. Contour irregularity of the fifth metacarpal suggests sequelae of prior trauma.  No fracture line identified to suggest acute injury.  No dislocation.  There is swelling about the second metacarpal phalangeal joint with mild ulnar subluxation.  No dislocation.  Calcific density medial to the ulnar styloid may be sequelae of remote trauma.  IMPRESSION: Postsurgical/remote post-traumatic changes of the third through fifth metacarpals. Some of the screws may have migrated dorsally. Recommend comparison with prior imaging (none  available in our system).  No acute fracture identified.If clinical concern for a fracture persists, recommend a repeat radiograph in 5-10 days to evaluate for interval change or callus formation.   Original Report Authenticated By: Jearld Lesch, M.D.    Tetanus updated. Wound care provided with wound closure as above. IM ABx for laceration with associated facial fracture.       Plan outpatient followup with maxillofacial surgeon on call Dr. Kelly Splinter  MDM   Fall with lacerations and facial fractures. No other injury trauma. CT head results discussed with radiologist and given no tenderness or deformity of the chin, does not require dedicated CT maxillofacial scan.   And x-ray and CT cervical spine reviewed as above. C-spine cleared.  Laceration and infection precautions provided.  Outpatient referral provided.  Vital signs and nursing notes reviewed.        Sunnie Nielsen, MD 06/16/12 [phone]

## 2012-06-16 NOTE — ED Notes (Signed)
Cleaned wounds with ns solution and gauze.  Dermabond to above eye and chin by MD.  Tegaderm to rt hand.

## 2012-06-16 NOTE — ED Notes (Signed)
Per EMS, pt from [facility].  Pt fell.  Found on floor immediately following.  Staff heard crash and found him on floor.  Frequent falls.  Small laceration on eyebrow.   Vitals:  116/70, hr 80, cbg 81, resp 16

## 2012-07-26 DIAGNOSIS — F22 Delusional disorders: Secondary | ICD-10-CM

## 2012-07-26 DIAGNOSIS — G20A1 Parkinson's disease without dyskinesia, without mention of fluctuations: Secondary | ICD-10-CM

## 2012-07-26 DIAGNOSIS — G20 Parkinson's disease: Secondary | ICD-10-CM

## 2012-07-26 DIAGNOSIS — K900 Celiac disease: Secondary | ICD-10-CM

## 2012-07-29 ENCOUNTER — Ambulatory Visit: Payer: Medicare Other | Admitting: Internal Medicine

## 2012-08-01 ENCOUNTER — Other Ambulatory Visit: Payer: Self-pay | Admitting: Surgery

## 2012-08-08 ENCOUNTER — Telehealth: Payer: Self-pay | Admitting: *Deleted

## 2012-08-08 ENCOUNTER — Encounter: Payer: Self-pay | Admitting: Internal Medicine

## 2012-08-08 NOTE — Telephone Encounter (Signed)
Message copied by Florene Glen on Mon Aug 08, 2012  9:41 AM ------      Message from: Annett Fabian      Created: Mon May 09, 2012  2:29 PM       Patient will need REV and labs mid April.  Orders are placed.  He is a resident at [facility].              [address] [place], [place] [address]      [phone] ------

## 2012-08-08 NOTE — Telephone Encounter (Signed)
Called [facility] to remind them of pt's appt and need for labs. I will fax orders to see if labs can be drawn there and results faxed to [place]. Spoke with [place] at [facility].

## 2012-08-15 ENCOUNTER — Other Ambulatory Visit: Payer: Self-pay | Admitting: *Deleted

## 2012-08-15 MED ORDER — DIPHENOXYLATE-ATROPINE 2.5-0.025 MG PO TABS: ORAL_TABLET | ORAL | Status: DC

## 2012-08-17 ENCOUNTER — Encounter: Payer: Self-pay | Admitting: Internal Medicine

## 2012-08-17 ENCOUNTER — Ambulatory Visit (INDEPENDENT_AMBULATORY_CARE_PROVIDER_SITE_OTHER): Payer: Medicare Other | Admitting: Internal Medicine

## 2012-08-17 ENCOUNTER — Other Ambulatory Visit (INDEPENDENT_AMBULATORY_CARE_PROVIDER_SITE_OTHER): Payer: Medicare Other

## 2012-08-17 ENCOUNTER — Telehealth: Payer: Self-pay | Admitting: Gastroenterology

## 2012-08-17 VITALS — BP 100/56 | HR 64

## 2012-08-17 DIAGNOSIS — K900 Celiac disease: Secondary | ICD-10-CM

## 2012-08-17 DIAGNOSIS — Z9889 Other specified postprocedural states: Secondary | ICD-10-CM

## 2012-08-17 DIAGNOSIS — D649 Anemia, unspecified: Secondary | ICD-10-CM

## 2012-08-17 DIAGNOSIS — Z9049 Acquired absence of other specified parts of digestive tract: Secondary | ICD-10-CM

## 2012-08-17 LAB — CBC WITH DIFFERENTIAL/PLATELET
Eosinophils Relative: 2.2 % (ref 0.0–5.0)
HCT: 34.3 % — ABNORMAL LOW (ref 39.0–52.0)
Hemoglobin: 11.4 g/dL — ABNORMAL LOW (ref 13.0–17.0)
Lymphs Abs: 1.3 10*3/uL (ref 0.7–4.0)
MCV: 89.6 fl (ref 78.0–100.0)
Monocytes Absolute: 0.3 10*3/uL (ref 0.1–1.0)
Monocytes Relative: 7.5 % (ref 3.0–12.0)
Neutro Abs: 2.1 10*3/uL (ref 1.4–7.7)
WBC: 3.8 10*3/uL — ABNORMAL LOW (ref 4.5–10.5)

## 2012-08-17 LAB — FERRITIN: Ferritin: 47.9 ng/mL (ref 22.0–322.0)

## 2012-08-17 MED ORDER — CHOLESTYRAMINE 4 G PO PACK: 1.0000 | PACK | Freq: Every day | ORAL | Status: DC

## 2012-08-17 MED ORDER — LOPERAMIDE HCL 2 MG PO CAPS: 2.0000 mg | ORAL_CAPSULE | Freq: Four times a day (QID) | ORAL | Status: DC | PRN

## 2012-08-17 NOTE — Progress Notes (Signed)
Subjective:    Patient ID: Steve Sloan, male    DOB: 12/08/1945, 67 y.o.   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 His celiac disease is diagnosed with an elevated tissue transglutaminase antibody. He was started on a gluten-free diet at his skilled nursing facility, though records sent with him today state "please note resident will state he is compliant with gluten-free diet, but staff contradict".  He reports continued loose stools, but not further trouble with significant diarrhea. He reports he is having 2-3 bowel movements per day. He does still wear an adult diaper. He denies abdominal pain. He denies nausea or vomiting. No fevers or chills. On reviewing his MAR he remains on Questran and loperamide.    Review of Systems As per history of present illness, otherwise negative  Current Medications, Allergies, Past Medical History, Past Surgical History, Family History and Social History were reviewed in [facility] medical record; also records from skilled nursing facility reviewed     Objective:   Physical Exam BP 100/56  Pulse 64 Constitutional: Chronically ill-appearing male sitting in a wheelchair in no acute distress, appearing older than stated age  HEENT: Normocephalic and atraumatic. Oropharynx is clear and moist. No scleral icterus.  Cardiovascular: Normal rate, regular rhythm and intact distal pulses.  Pulmonary/chest: Clear bilaterally  Abdominal: Soft, nontender, nondistended. Bowel sounds active throughout. Well-healed surgical scars  Extremities: no clubbing, cyanosis, or  edema  Neurological: Moving all 4 extremities, speech is slow but intelligible, oriented to person place and time.  Skin: Skin is warm and dry.  Psychiatric: Normal mood and flat affect.     CBC    Component Value Date/Time   WBC 3.8* 08/17/2012 1148   RBC 3.82* 08/17/2012 1148   HGB 11.4* 08/17/2012 1148   HCT 34.3* 08/17/2012 1148   PLT 159.0 08/17/2012 1148   MCV 89.6 08/17/2012 1148   MCH 30.5 12/25/2009 0634   MCHC 33.3 08/17/2012 1148   RDW 17.0* 08/17/2012 1148   LYMPHSABS 1.3 08/17/2012 1148   MONOABS 0.3 08/17/2012 1148   EOSABS 0.1 08/17/2012 1148   BASOSABS 0.1 08/17/2012 1148   Hgb 10.1 - jan 2014  TTG - 65 jan 2014  CMP     Component Value Date/Time   NA 142 05/04/2012 1145   K 3.8 05/04/2012 1145   CL 109 05/04/2012 1145   CO2 28 05/04/2012 1145   GLUCOSE 92 05/04/2012 1145   BUN 26* 05/04/2012 1145   CREATININE 0.9 05/04/2012 1145   CALCIUM 9.0 05/04/2012 1145   PROT 7.0 05/04/2012 1145   ALBUMIN 3.7 05/04/2012 1145   AST 15 05/04/2012 1145   ALT 13 05/04/2012 1145   ALKPHOS 57 05/04/2012 1145   BILITOT 0.4 05/04/2012 1145   GFRNONAA >60 12/28/2009 0715   GFRAA  Value: >60        The eGFR has been calculated using the MDRD equation. This calculation has not been validated in all clinical situations. eGFR's persistently <60 mL/min signify possible Chronic Kidney Disease. 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  Celiac disease with anemia -- celiac disease was diagnosed by very elevated TTG antibody and fits with his overall clinical picture. Certainly some part of his loose stools are secondary to his  history of colectomy with ileorectal anastomosis.  We discussed celiac disease today at length including the gluten-free diet. He was given a copy of a gluten-free diet. There is note from his skilled nursing facility that he may be nonadherent to gluten-free diet. I will recheck a TTG antibody today. His hemoglobin has improved and so perhaps the small bowel has healed some.  Certainly the food that he gets from the cafeteria/food service at his skilled nursing facility his order gluten-free, he just has to focus on eating gluten-free snacks.  Discussed this at length today. He will continue iron supplementation therapy. For loose stools he can continue on Questran and loperamide as needed. Ideally would like to see a TTG normalize completely to indicate small bowel healing and gluten-free adherence.

## 2012-08-17 NOTE — Telephone Encounter (Signed)
Faxed office notes to [facility] [phone]

## 2012-08-17 NOTE — Patient Instructions (Addendum)
Continue your current doses of Questran and Imodium  Your physician has requested that you go to the basement for lab work before leaving today.                                               We are excited to introduce MyChart, a new best-in-class service that provides you online access to important information in your electronic medical record. We want to make it easier for you to view your health information - all in one secure location - when and where you need it. We expect MyChart will enhance the quality of care and service we provide.  When you register for MyChart, you can:    View your test results.    Request appointments and receive appointment reminders via email.    Request medication renewals.    View your medical history, allergies, medications and immunizations.    Communicate with your physician's office through a password-protected site.    Conveniently print information such as your medication lists.  To find out if MyChart is right for you, please talk to a member of our clinical staff today. We will gladly answer your questions about this free health and wellness tool.  If you are age 15 or older and want a member of your family to have access to your record, you must provide written consent by completing a proxy form available at our office. Please speak to our clinical staff about guidelines regarding accounts for patients younger than age 38.  As you activate your MyChart account and need any technical assistance, please call the MyChart technical support line at [phone]) or email your question to [email or web address]. If you email your question(s), please include your name, a return phone number and the best time to reach you.  If you have non-urgent health-related questions, you can send a message to our office through MyChart at [email or web address]. If you have a medical emergency, call 911.  Thank you for using MyChart as your new  health and wellness resource!   MyChart licensed from [employer],  [id number]. Patents Pending.   Gluten-Free Diet Gluten is a protein found in many grains. Gluten is present in wheat, rye, and barley. Gluten from wheat, rye, and barley protein interferes with the absorption of food in people with gluten sensitivity. It may also cause intestinal injury when eaten by individuals with gluten sensitivity.  A sample piece (biopsy) of the small intestine is usually required for a positive diagnosis of gluten sensitivity. Dietary treatment consists of eliminating foods and food ingredients from wheat, rye, and barley. When these are taken out of the diet completely, most people regain function of the small intestine. Strict compliance is important even during symptom-free periods. People with gluten sensitivity need to be on a gluten-free diet for a lifetime. During the first stages of treatment, some people will also need to restrict dairy products that contain lactose, which is a naturally occurring sugar. Lactose is difficult to absorb when the small intestines are damaged (lactose intolerance).  WHO NEEDS THIS DIET Some people who have certain diseases need to be on a gluten-free diet. These diseases include:  Celiac disease.  Nontropical sprue.  Gluten-sensitive enteropathy.  Dermatitis herpetiformis. SPECIAL NOTES  Read all labels because gluten may have been added as an incidental ingredient. Words to check for on the  label include: flour, starch, durum flour, graham flour, phosphated flour, self-rising flour, semolina, farina, modified food starch, cereal, thickening, fillers, emulsifiers, any kind of malt flavoring, and hydrolyzed vegetable protein. A registered dietician can help you identify possible harmful ingredients in the foods you normally eat.  If you are not sure whether an ingredient contains gluten, check with the manufacturer. Note that some manufacturers may  change ingredients without notice. Always read labels.  Since flour and cereal products are often used in the preparation of foods, it is important to be aware of the methods of preparation used, as well as the foods themselves. This is especially true when you are dining out. Starches  Allowed: Only those prepared from arrowroot, corn, potato, rice, and bean flours. Rice wafers(*), pure cornmeal tortillas, popcorn, some crackers, and chips(*). Hot cereals made from cornmeal. Ask your dietician which specific hot and cold cereals are allowed. White or sweet potatoes, yams, hominy, rice or wild rice, and special gluten-free pasta. Some oriental rice noodles or bean noodles.  Avoid: All wheat and rye cereals, wheat germ, barley, bran, graham, malt, bulgur, and millet(-). NOTE: Avoid cereals containing malt as a flavoring, such as rice cereal. Regular noodles, spaghetti, macaroni, and most packaged rice mixes(*). All others containing wheat, rye, or barley. Vegetables  Allowed: All plain, fresh, frozen, or canned vegetables.  Avoid: Creamed vegetables(*) and vegetables canned in sauces(*). Any prepared with wheat, rye, or barley. Fruit  Allowed: All fresh, frozen, canned, or dried fruits. Fruit juices.  Avoid: Thickened or prepared fruits and some pie fillings(*). Meat and Meat Substitutes  Allowed: Meat, fish, poultry, or eggs prepared without added wheat, rye, or barley. Luncheon meat(*), frankfurters(*), and pure meat. All aged cheese and processed cheese products(*). Cottage cheese(+) and cream cheese(+). Dried beans, dried peas, and lentils.  Avoid: Any meat or meat alternate containing wheat, rye, barley, or gluten stabilizers. Bread-containing products, such as Swiss steak, croquettes, and meatloaf. Tuna canned in vegetable broth(*); [place] with HVP injected as part of the basting; any cheese product containing oat gum as an ingredient. Milk  Allowed: Milk. Yogurt made with allowed  ingredients(*).  Avoid: Commercial chocolate milk which may have cereal added(*). Malted milk. Soups and Combination Foods  Allowed: Homemade broth and soups made with allowed ingredients; some canned or frozen soups are allowed(*). Combination or prepared foods that do not contain gluten(*). Read labels.  Avoid: All soups containing wheat, rye, or barley flour. Bouillon and bouillon cubes that contain hydrolyzed vegetable protein (HVP). Combination or prepared foods that contain gluten(*). Desserts  Allowed:  Custard, some pudding mixes(*), homemade puddings from cornstarch, rice, and tapioca. Gelatin desserts, ices, and sherbet(*). Cake, cookies, and other desserts prepared with allowed flours. Some commercial ice creams(*). Ask your dietician about specific brands of dessert that are allowed.  Avoid: Cakes, cookies, doughnuts, and pastries that are prepared with wheat, rye, or barley flour. Some commercial ice creams(*), ice cream flavors which contain cookies, crumbs, or cheesecake(*). Ice cream cones. All commercially prepared mixes for cakes, cookies, and other desserts(*). Bread pudding and other puddings thickened with flour. Sweets  Allowed: Sugar, honey, syrup(*), molasses, jelly, jam, plain hard candy, marshmallows, gumdrops, homemade candies free from wheat, rye, or barley. Coconut.  Avoid: Commercial candies containing wheat, rye, or barley(*). Certain buttercrunch toffees are dusted with wheat flour. Ask your dietician about specific brands that are not allowed. Chocolate-coated nuts, which are often rolled in flour. Fats and Oils  Allowed: Butter, margarine, vegetable oil, sour cream(+),  whipping cream, shortening, lard, cream, mayonnaise(*). Some commercial salad dressings(*). Peanut butter.  Avoid: Some commercial salad dressings(*). Beverages  Allowed: Coffee (regular or decaffeinated), tea, herbal tea (read label to be sure that no wheat flour has been added). Carbonated  beverages and some root beers(*). Wine, sake, and distilled spirits, such as gin, vodka, and whiskey.  Avoid:  Certain cereal beverages. Ask your dietician about specific brands that are not allowed. Beer (unless gluten-free), ale, malted milk, and some root beers, wine, and sake. Condiments/ Miscellaneous  Allowed: Salt, pepper, herbs, spices, extracts, and food colorings. Monosodium glutamate (MSG). Cider, rice, and wine vinegar. Baking soda and baking powder. Certain soy sauces. Ask your dietician about specific brands that are allowed. Nuts, coconut, chocolate, and pure cocoa powder.  Avoid: Some curry powder(*), some dry seasoning mixes(*), some gravy extracts(*), some meat sauces(*), some catsup(*), some prepared mustard(*), horseradish(*), some soy sauce(*), chip dips(*), and some chewing gum(*). Yeast extract (contains barley). Caramel color (may contain malt). Ask your dietician about specific brands of condiments to avoid. Flour and Thickening Agents  Allowed: Arrowroot starch (A); Corn bran (B); Corn flour (B,C,D); Corn germ (B); Cornmeal (B,C,D); Corn starch (A); Potato flour (B,C,E); Potato starch flour (B,C,E); Rice bran (B); Rice flours: Plain, brown (B,C,D,E), and Sweet (A,B,C,F). Rice polish (B,C,G); Soy flour (B,C,G); Tapioca starch (A). The flour and thickening agents described above are good for: (A) Good thickening agent (B) Good when combined with other flours (C) Best combined with milk and eggs in baked products (D) Best in grainy-textured products (E) Produces drier product than other flours (F) Produces moister product than other flours (G) Adds distinct flavor to product. Use in moderation. (*) Check labels and investigate any questionable ingredients.  (-) Additional research is needed before this product can be recommended. (+) Check vegetable gum used. SAMPLE MEAL PLAN Breakfast   Orange juice.  Banana.  Rice or corn cereal.  Toast (gluten-free  bread).  Heart-healthy tub margarine.  Jam.  Milk.  Coffee or tea. Lunch  Chicken salad sandwich (with gluten-free bread and mayonnaise).  Sliced tomatoes.  Heart-healthy tub margarine.  Apple.  Milk.  Coffee or tea. Dinner  [place].  Baked potato.  Broccoli.  Lettuce salad with gluten-free dressing.  Gluten-free bread.  Custard.  Heart-healthy margarine.  Coffee or tea. These meal plans are provided as samples. Your daily meal plans will vary. Document Released: 04/13/2005 Document Revised: 10/13/2011 Document Reviewed: 05/24/2011 [facility] Patient Information 2013 [place], [place].

## 2012-08-18 LAB — TISSUE TRANSGLUTAMINASE, IGA: Tissue Transglutaminase Ab, IgA: 61.8 U/mL — ABNORMAL HIGH (ref <20)

## 2012-08-30 ENCOUNTER — Encounter: Payer: Self-pay | Admitting: Neurology

## 2012-08-30 ENCOUNTER — Ambulatory Visit (INDEPENDENT_AMBULATORY_CARE_PROVIDER_SITE_OTHER): Payer: Medicare Other | Admitting: Neurology

## 2012-08-30 VITALS — Temp 97.5°F

## 2012-08-30 DIAGNOSIS — G20 Parkinson's disease: Secondary | ICD-10-CM

## 2012-08-30 NOTE — Patient Instructions (Signed)
Parkinson's Disease Parkinson's disease is a disorder of the central nervous system, which includes the brain and spinal cord. A person with this disease slowly loses the ability to completely control body movements. Within the brain, there is a group of nerve cells (basal ganglia) that help control movement. The basal ganglia are damaged and do not work properly in a person with Parkinson's disease. In addition, the basal ganglia produce and use a brain chemical called dopamine. The dopamine chemical sends messages to other parts of the body to control and coordinate body movements. Dopamine levels are low in a person with Parkinson's disease. If the dopamine levels are low, then the body does not receive the correct messages it needs to move normally.  CAUSES  The exact reason why the basal ganglia get damaged is not known. Some medical researchers have thought that infection, genes, environment, and certain medicines may contribute to the cause.  SYMPTOMS   An early symptom of Parkinson's disease is often an uncontrolled shaking (tremor) of the hands. The tremor will often disappear when the affected hand is consciously used.  As the disease progresses, walking, talking, getting out of a chair, and new movements become more difficult.  Muscles get stiff and movements become slower.  Balance and coordination become harder.  Depression, trouble swallowing, urinary problems, constipation, and sleep problems can occur.  Later in the disease, memory and thought processes may deteriorate. DIAGNOSIS  There are no specific tests to diagnose Parkinson's disease. You may be referred to a neurologist for evaluation. Your caregiver will ask about your medical history, symptoms, and perform a physical exam. Blood tests and imaging tests of your brain may be performed to rule out other diseases. The imaging tests may include an MRI or a CT scan. TREATMENT  The goal of treatment is to relieve symptoms.  Medicines may be prescribed once the symptoms become troublesome. Medicine will not stop the progression of the disease, but medicine can make movement and balance better and help control tremors. Speech and occupational therapy may also be prescribed. Sometimes, surgical treatment of the brain can be done in young people. HOME CARE INSTRUCTIONS  Get regular exercise and rest periods during the day to help prevent exhaustion and depression.  If getting dressed becomes difficult, replace buttons and zippers with Velcro and elastic on your clothing.  Take all medicine as directed by your caregiver.  Install grab bars or railings in your home to prevent falls.  Go to speech or occupational therapy as directed.  Keep all follow-up visits as directed by your caregiver. SEEK MEDICAL CARE IF:  Your symptoms are not controlled with your medicine.  You fall.  You have trouble swallowing or choke on your food. MAKE SURE YOU:  Understand these instructions.  Will watch your condition.  Will get help right away if you are not doing well or get worse. Document Released: 04/10/2000 Document Revised: 10/13/2011 Document Reviewed: 05/13/2011 [facility] Patient Information 2013 [place], [place]. Place Parkinson disease patient instructions here.

## 2012-08-30 NOTE — Progress Notes (Signed)
Chief Complaint  Patient presents with  . Follow-up    parkinson's , DBS, rm 11   [name]  Provider:  Dr Cassady Sloan Referring Provider: Maxwell Caul, MD Primary Care Physician:  Steve Sleeper, MD  Chief Complaint  Patient presents with  . Follow-up    parkinson's , DBS, rm 11    HPI:  Steve Sloan is a 67 y.o. male here as a referral from Steve. Leanord Sloan. He has seen Steve Sloan at [facility].  Established patient of Steve Sloan at [facility], and was last seen in 03/23/2012. The patient is a history of Parkinson's disease for over 20 years he has been followed through the [place] hospital system but is now residing in a nursing facility. The patient has a deep brain stimulator placement to the right side of the brain, but the original insertion date was not known at the time. Out today and on but it was 07/22/2001. The patient is here today with the [employer] representative to have this stimulator interrogated and and 2 documented settings. His battery is depleted under and and therefore it is likely necessary for the patient to undergo a battery change operation. 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 The patient is on the following Parkinson medications #1 amantadine #2 Sinemet #3 Comtan.  The [employer] representative hilar and today that it is assumed that the battery must have stopped working over a ready 6 or 7 years ago, but apparently this was missed. The patient arrived today from his extended care facility was old a list of actual medications.  Review of Systems: Out of a complete 14 system review, the patient complains of only the following symptoms, and all other reviewed systems are negative.   History   Social History  . Marital  Status: Divorced    Spouse Name: N/A    Number of Children: 3  . Years of Education: N/A   Occupational History  . disabled    Social History Main Topics  . Smoking status: Never Smoker   . Smokeless tobacco: Never Used  . Alcohol Use: No  . Drug Use: No  . Sexually Active: Not on file   Other Topics Concern  . Not on file   Social History Narrative   He is retired from Patent examiner.  Is working as a Agricultural consultant at the [employer] .   He has been married to his wife, Steve Sloan, for 57 years, with whom he has four children. All have their own families by now, all healthy.    Drinks coffee in the morning.    Denies alcohol, tobacco and drug use.    History reviewed. No pertinent family history.  Past Medical History  Diagnosis Date  . Parkinson's disease   . Hypotension   . Brain tumor   . Encounter for intubation   . Dysphagia   . Enterocutaneous fistula   . Chronic diarrhea   . Anemia   . Anal fistula     Past Surgical History  Procedure Laterality Date  . Pacemaker insertion    . Deep brain stimulator placement      with removal of brain cells  . Colectomy      Current Outpatient Prescriptions  Medication Sig Dispense Refill  . amantadine (SYMMETREL) 100 MG capsule Take 100  mg by mouth daily.      . carbidopa-levodopa (SINEMET) 25-100 MG per tablet Take 1 tablet by mouth 4 (four) times daily.      . cholestyramine (QUESTRAN) 4 G packet Take 1 packet by mouth daily.  60 each  6  . diphenoxylate-atropine (LOMOTIL) 2.5-0.025 MG per tablet Take two tablets by mouth twice daily as needed for diarrhea  120 tablet  0  . doxycycline (VIBRAMYCIN) 50 MG capsule Take 50 mg by mouth 2 (two) times daily.      . ferrous sulfate 325 (65 FE) MG tablet Take 325 mg by mouth 2 (two) times daily with a meal.       . hydroxypropyl methylcellulose (ISOPTO TEARS) 2.5 % ophthalmic solution Place 1 drop into both eyes 2 (two) times daily.      Marland Kitchen loperamide (IMODIUM) 2 MG capsule  Take 1 capsule (2 mg total) by mouth 4 (four) times daily as needed. For loose stools.  30 capsule  6  . Polyvinyl Alcohol (AKWA TEARS OP) Apply to eye. Instill drops to affected eyes three times daily, wait 3-5 minutes between 2eye meds, wait 5-61min.before or after zirgan      . QUEtiapine (SEROQUEL) 25 MG tablet Take 25 mg by mouth at bedtime.      . saccharomyces boulardii (FLORASTOR) 250 MG capsule Take 250 mg by mouth daily.      Marland Kitchen tobramycin-dexamethasone (TOBRADEX) ophthalmic ointment Place 1 application into both eyes 2 (two) times daily.      . Vitamin D, Ergocalciferol, (DRISDOL) 50000 UNITS CAPS Take 50,000 Units by mouth every 30 (thirty) days.       No current facility-administered medications for this visit.    Allergies as of 08/30/2012 - Review Complete 08/30/2012  Allergen Reaction Noted  . Donepezil  08/30/2012    Vitals: Temp(Src) 97.5 F (36.4 C) (Oral) Last Weight:  Wt Readings from Last 1 Encounters:  08/30/12 172 lb (78.019 kg)   Last Height:   Ht Readings from Last 1 Encounters:  06/16/12 5\' 10"  (1.778 m)   Vision Screening:  See vitals . Physical exam: The patient demonstrates eye opening apraxia, but is able to him for direct questions. He appears not in acute distress. He is slender and groomed.  His facial skull shows asymmetry, the patient lost the left eyebrow in what he described as a skin cancer surgery. He has normal eye movements. Evident is that the patient is rigid in all 4 extremities and that passive movement demonstrates limited range of movement as well as cogwheel rigidity although biceps triceps wrist hip and knee movements. The patient said in a wheelchair during this evaluation. I cannot see a preference or dominance of Parkinson syndrome symptoms on left versus  right side -his  deep in the deep brain stimulator was implanted to the right brain.  Cardiovascular:  Regular rate and rhythm , without  murmurs or carotid bruit, and without  distended neck veins. Respiratory: Lungs are clear to auscultation. Skin:  Without evidence of edema, or rash Trunk: BMI is  elevated and patient  has normal posture.  Neurologic exam : The patient is awake and alert, oriented to place and time.  Memory subjective   described as intact. There is a normal attention span & concentration ability. Speech is non spontaneous , non  fluent with   dysarthria, dysphonia  Mood and affect appear depressed , lethargic.  Cranial nerves: Pupils are equal and briskly reactive to light. Funduscopic exam without  evidence of pallor or edema. Extraocular movements  in vertical and horizontal planes intact and without nystagmus. Visual fields by finger perimetry are intact. Hearing to finger rub intact.  Facial sensation intact to fine touch. Motor exam:   Normal tone and normal muscle bulk and symmetric normal strength in all extremities.  Sensory:  Fine touch, pinprick and vibration were tested in all extremities. Proprioception is tested in the upper extremities only.  Coordination: Rapid alternating movements in the fingers/hands is tested and abnormal.  evidence of ataxia, dysmetria and tremor.  Gait and station: deferred. He walked unassisted with minimal step width of 2-3 inches and slow. Propulsive and retropulsive trend is noted.   wheelchair bound   Patient  Will be referred for a new battery to be inserted . He lives at [facility]  In  .[place] . He prefers a Biomedical engineer to perform this surgery.

## 2012-09-13 ENCOUNTER — Other Ambulatory Visit: Payer: Self-pay | Admitting: *Deleted

## 2012-09-13 ENCOUNTER — Telehealth: Payer: Self-pay | Admitting: Gastroenterology

## 2012-09-13 DIAGNOSIS — F22 Delusional disorders: Secondary | ICD-10-CM

## 2012-09-13 DIAGNOSIS — K900 Celiac disease: Secondary | ICD-10-CM

## 2012-09-13 DIAGNOSIS — G20 Parkinson's disease: Secondary | ICD-10-CM

## 2012-09-13 DIAGNOSIS — F0390 Unspecified dementia without behavioral disturbance: Secondary | ICD-10-CM

## 2012-09-13 MED ORDER — DIPHENOXYLATE-ATROPINE 2.5-0.025 MG PO TABS: ORAL_TABLET | ORAL | Status: DC

## 2012-09-13 NOTE — Telephone Encounter (Signed)
Ebony Cargo NP for pt called to request a copy of pt's labs

## 2012-09-15 ENCOUNTER — Telehealth: Payer: Self-pay | Admitting: Neurology

## 2012-09-16 NOTE — Telephone Encounter (Signed)
I spoke to Darl Pikes from [facility], she received the notes.  She also wanted to know who needs to order the battery for the DBS.  I told her the referral has already been sent to [facility] neurosurgical to have procedure done.  She said that it also has to be coordinated with the nursing home.

## 2012-10-03 DIAGNOSIS — K591 Functional diarrhea: Secondary | ICD-10-CM

## 2012-10-03 DIAGNOSIS — K900 Celiac disease: Secondary | ICD-10-CM

## 2012-11-08 ENCOUNTER — Non-Acute Institutional Stay (SKILLED_NURSING_FACILITY): Payer: Medicare Other | Admitting: Internal Medicine

## 2012-11-08 DIAGNOSIS — K900 Celiac disease: Secondary | ICD-10-CM

## 2012-11-08 DIAGNOSIS — F22 Delusional disorders: Secondary | ICD-10-CM

## 2012-11-08 DIAGNOSIS — G20 Parkinson's disease: Secondary | ICD-10-CM

## 2012-12-07 ENCOUNTER — Other Ambulatory Visit (HOSPITAL_COMMUNITY): Payer: Self-pay | Admitting: Internal Medicine

## 2012-12-07 DIAGNOSIS — R1311 Dysphagia, oral phase: Secondary | ICD-10-CM

## 2012-12-08 ENCOUNTER — Other Ambulatory Visit: Payer: Self-pay | Admitting: Neurosurgery

## 2012-12-09 NOTE — Progress Notes (Signed)
Patient ID: Steve Sloan, male   DOB: 08/08/1945, 67 y.o.   MRN: [id number]           PROGRESS NOTE  DATE:  11/08/2012  FACILITY: [facility]    LEVEL OF CARE:   SNF   Routine Visit   CHIEF COMPLAINT:  Routine [facility] visit for the month of June.    HISTORY OF PRESENT ILLNESS:  Steve Sloan is a gentleman with multiple medical issues.  He has been in the facility since April 2012.    Most of his disability is secondary to Parkinson's disease.  He has a history of a deep brain stimulant implant.  He follows with Dr. Vickey Huger of Neurology.  Recent suggestion to have the batteries replaced and will need surgery for this.    He is also followed by GI.  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  He, therefore, has liquid stools.   His stool has been heme-positive.  There was also some suggestion that he may have celiac disease although per Dr. Allena Katz, the original colectomy was secondary to a sigmoid volvulus.   The ileorectal anastomosis was in 2011.  It would appear that the celiac diagnosis has been confirmed by an elevated tissue  transglutaminase antibody.  He was started on a gluten-free diet at the facility.  The actual compliance from both the patient  is really unclear.    PHYSICAL EXAMINATION:   CHEST/RESPIRATORY:  Clear air entry bilaterally.   CARDIOVASCULAR:  CARDIAC:   Heart sounds are normal.  No murmurs.   GASTROINTESTINAL:  ABDOMEN:   Soft, nontender.  No masses are noted.   SKIN:  INSPECTION:  He has had a recent right temple biopsy by Dermatology, Dr. Terri Piedra.   MUSCULOSKELETAL:   GAIT/STATION:  Predominantly wheelchair, although he can walk short distances with a walker.   NEUROLOGICAL:   His rigidity is not badly controlled, although he is still quite bradykinetic.  He is on Sinemet CR, amantadine, the Sinemet at 50/200 q.i.d.   ASSESSMENT/PLAN:  Celiac disease.  Compliance with a gluten-free diet is really not that clear.    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     History of a delusional disorder.  On Seroquel.  Followed by Psychiatry.  The Seroquel increase would need to be carefully monitored given his Parkinson's disease.  Dopamine agents, including Sinemet, can cause hallucinations by themselves.    CPT CODE: 40981

## 2012-12-14 ENCOUNTER — Encounter (HOSPITAL_COMMUNITY)
Admission: RE | Admit: 2012-12-14 | Discharge: 2012-12-14 | Disposition: A | Discharge status: Home / Self Care | Payer: Medicare Other | Source: Ambulatory Visit | Attending: Internal Medicine | Admitting: Internal Medicine

## 2012-12-14 ENCOUNTER — Ambulatory Visit (HOSPITAL_COMMUNITY)
Admission: RE | Admit: 2012-12-14 | Discharge: 2012-12-14 | Disposition: A | Discharge status: Home / Self Care | Payer: Medicare Other | Source: Ambulatory Visit | Attending: Internal Medicine | Admitting: Internal Medicine

## 2012-12-14 DIAGNOSIS — R1310 Dysphagia, unspecified: Secondary | ICD-10-CM | POA: Insufficient documentation

## 2012-12-14 DIAGNOSIS — R1311 Dysphagia, oral phase: Secondary | ICD-10-CM | POA: Insufficient documentation

## 2012-12-14 DIAGNOSIS — G20A1 Parkinson's disease without dyskinesia, without mention of fluctuations: Secondary | ICD-10-CM | POA: Insufficient documentation

## 2012-12-14 DIAGNOSIS — G20 Parkinson's disease: Secondary | ICD-10-CM | POA: Insufficient documentation

## 2012-12-14 NOTE — Procedures (Signed)
Objective Swallowing Evaluation: Modified Barium Swallowing Study  Patient Details  Name: Steve Sloan MRN: [id number] Date of Birth: [birth year]  Today's Date: 12/14/2012 Time: 1155-1224 SLP Time Calculation (min): 29 min  Past Medical History:  Past Medical History  Diagnosis Date  . Parkinson's disease   . Hypotension   . Brain tumor   . Encounter for intubation   . Dysphagia   . Enterocutaneous fistula   . Chronic diarrhea   . Anemia   . Anal fistula    Past Surgical History:  Past Surgical History  Procedure Laterality Date  . Pacemaker insertion    . Deep brain stimulator placement      with removal of brain cells  . Colectomy     HPI:  67 y.o. with hx of advanced Parkinson's disease status post brain stimulator placement and chronic dysphagia referred for OPMBS to determine safest PO diet.  Pt has had MBS assessments in the past, the most recent on 07/14/11.  Dx with severe pharyngeal dysphagia with significant pharyngeal stasis and silent aspiration of thin liquids.  Pt has hx of PEG, recurrent aspiration pna, chronic malnutrition, falls, celiac disease with reports of some noncompliance with diet recs.   Nursing assistant who accompanied Steve Sloan to MBS describes coughing/choking during meals; Steve Sloan acknowledged that his swallowing has been occasionally problematic.  Presents with masked facies; hypokinetic dysarthria with low volume and fast-paced speech output, poor pitch regulation/ monotone.       Assessment / Plan / Recommendation Clinical Impression  Dysphagia Diagnosis: Severe oral phase dysphagia; Moderate-severe pharyngeal phase dysphagia  Clinical impression: Pt's swallow function remains relatively consistent with findings from March 2013 MBS.  Oral phase is marked by lingual pumping, reduced control and reduced propulsive force into pharynx.  Mobility of hyolaryngeal complex is severely impaired, leading to residue throughout pharynx/reduced  pharyngeal peristalsis, and overt aspiration of thin liquids with no cough response elicited.  Nectar thick liquids entered larynx and reached level of vocal cords; pt required cues to cough in order to clear larynx.   As consumption progresses, accumulation of residue in pharynx increases, and aspiration risk increases.  Pt viewed video of swallow in real time.  Basic anatomy of swallow mechanism and physiological changes consistent with PD were identified to pt.  He observed incidents of aspiration.  We discussed possible PO diets:  1) eating a modified diet with thicker liquids to minimize but not eliminate aspiration risk (puree, nectar-thick) vs. 2) eating per his food choices and accepting aspiration likelihood.  Despite repeated, simple review of results and choices, cues, repetition to facilitate recall, by end of session pt was not able to state or recall that material was transitioning to lungs, nor that it could cause pna and its consequences.   He asserted that he would like to eat regular foods and drink thin liquids.  He acknowledged that discussion of his diet is a recurring theme and would like for it to be resolved.  Recommended to pt that he discuss with MD to identify plan.  Reiterated with pt that any PO diet creates risk due to chronicity and progressive nature of his dysphagia - it is difficult to assess how much of this discussion he understood and will remember.     Treatment Recommendation  Defer treatment plan to SLP at SNF   Diet Recommendation  (see clinical impressions)   Medication Administration: Crushed with puree Supervision: Full supervision/cueing for compensatory strategies Compensations: Slow rate;Small sips/bites;Multiple dry swallows after  each bite/sip;Clear throat intermittently Postural Changes and/or Swallow Maneuvers: Seated upright 90 degrees;Upright 30-60 min after meal    Other  Recommendations Oral Care Recommendations: Oral care BID               General Type of Study: Modified Barium Swallowing Study Reason for Referral: Objectively evaluate swallowing function Previous Swallow Assessment: 07/14/11:  Dysphagia Diagnosis: Severe pharyngeal phase dysphagia;Moderate cervical esophageal phase dysphagia;Moderate oral phase dysphagia;  Pt presents with moderate oral and severe pharyngeal dysphagia characterized by decr lingual peristalsis, lingual pumping, reduced pharyngeal persistalsis, delayed swallow reflex, reducted laryngeal closure and cricopharyngeal dysfunction.  Pt demonstrated SILENT aspiration with thin during the swallow with demonstration of extended breath hold  during 2nd swallow to aid clearance of overt stasis in pharynx. - This was completed without cues-clearly compensatory. Severe stasis in pharynx with all consistencies were NOT sensed but dry swallows aided to decr stasis *but did not eliminate it.  As testing continued, pt with worsening stasis.  Aspiration also occurred after the swallow d/t stasis.  Cued cough did not clear aspirates or penetrates.  Pt  likely has been chronically aspirating without awareness due to silent nature of dysphagia and has been tolerating.  Based on MBS results, aspiration will likely occur present with all po intake and pt may tolerate thinner consistencies better.   Diet Prior to this Study: Nectar-thick liquids Temperature Spikes Noted: No Respiratory Status: Room air History of Recent Intubation: No Behavior/Cognition: Alert;Cooperative Oral Cavity - Dentition: Poor condition Oral Motor / Sensory Function:  (impaired ROM CN VII, XII) Self-Feeding Abilities: Able to feed self Patient Positioning: Upright in bed Baseline Vocal Quality: Hoarse;Low vocal intensity Volitional Cough: Weak Volitional Swallow: Able to elicit Anatomy: Within functional limits Pharyngeal Secretions: Not observed secondary MBS    Reason for Referral Objectively evaluate swallowing function   Oral Phase Oral  Preparation/Oral Phase Oral Phase: Impaired Oral - Honey Oral - Honey Cup: Lingual pumping;Reduced posterior propulsion;Piecemeal swallowing;Decreased velopharyngeal closure;Incomplete tongue to palate contact;Weak lingual manipulation Oral - Nectar Oral - Nectar Cup: Weak lingual manipulation;Lingual pumping;Incomplete tongue to palate contact;Reduced posterior propulsion;Piecemeal swallowing;Decreased velopharyngeal closure Oral - Thin Oral - Thin Cup: Weak lingual manipulation;Lingual pumping;Incomplete tongue to palate contact;Reduced posterior propulsion;Piecemeal swallowing;Decreased velopharyngeal closure Oral - Solids Oral - Puree: Impaired mastication;Weak lingual manipulation;Lingual pumping;Incomplete tongue to palate contact;Reduced posterior propulsion;Piecemeal swallowing Oral Phase - Comment Oral Phase - Comment: severely impaired oral phase    Pharyngeal Phase Pharyngeal Phase Pharyngeal Phase: Impaired Pharyngeal - Honey Pharyngeal - Honey Cup: Premature spillage to pyriform sinuses;Reduced pharyngeal peristalsis;Reduced epiglottic inversion;Reduced anterior laryngeal mobility;Reduced laryngeal elevation;Reduced airway/laryngeal closure;Penetration/Aspiration during swallow;Reduced tongue base retraction;Pharyngeal residue - pyriform sinuses;Pharyngeal residue - valleculae Penetration/Aspiration details (honey cup): Material enters airway, remains ABOVE vocal cords then ejected out Pharyngeal - Nectar Pharyngeal - Nectar Cup: Delayed swallow initiation;Premature spillage to pyriform sinuses;Reduced pharyngeal peristalsis;Reduced epiglottic inversion;Reduced anterior laryngeal mobility;Reduced laryngeal elevation;Reduced airway/laryngeal closure;Reduced tongue base retraction;Penetration/Aspiration during swallow;Pharyngeal residue - valleculae;Pharyngeal residue - pyriform sinuses Penetration/Aspiration details (nectar cup): Material enters airway, CONTACTS cords then ejected  out;Material enters airway, CONTACTS cords and not ejected out Pharyngeal - Thin Pharyngeal - Thin Cup: Premature spillage to pyriform sinuses;Reduced pharyngeal peristalsis;Reduced epiglottic inversion;Reduced anterior laryngeal mobility;Reduced laryngeal elevation;Reduced airway/laryngeal closure;Reduced tongue base retraction;Penetration/Aspiration during swallow;Penetration/Aspiration after swallow;Trace aspiration;Pharyngeal residue - valleculae;Pharyngeal residue - pyriform sinuses Penetration/Aspiration details (thin cup): Material enters airway, passes BELOW cords without attempt by patient to eject out (silent aspiration) Pharyngeal - Solids Pharyngeal - Puree: Premature spillage to valleculae;Reduced  pharyngeal peristalsis;Reduced epiglottic inversion;Reduced anterior laryngeal mobility;Reduced laryngeal elevation;Reduced airway/laryngeal closure;Reduced tongue base retraction;Pharyngeal residue - valleculae;Pharyngeal residue - pyriform sinuses  Cervical Esophageal Phase    GO    Cervical Esophageal Phase Cervical Esophageal Phase:  (not assessed)    Functional Assessment Tool Used: clinical judgement Functional Limitations: Swallowing Swallow Current Status (V7846): At least 80 percent but less than 100 percent impaired, limited or restricted Swallow Goal Status [phone]): At least 80 percent but less than 100 percent impaired, limited or restricted Swallow Discharge Status [phone]): At least 80 percent but less than 100 percent impaired, limited or restricted   Kyce Ging L. Samson Frederic, [place] CCC/SLP Pager [phone]  Blenda Mounts Laurice 12/14/2012, 3:43 PM

## 2012-12-20 ENCOUNTER — Encounter (HOSPITAL_COMMUNITY): Payer: Self-pay | Admitting: Pharmacy Technician

## 2012-12-21 ENCOUNTER — Encounter (HOSPITAL_COMMUNITY): Payer: Self-pay | Admitting: *Deleted

## 2012-12-21 MED ORDER — CEFAZOLIN SODIUM-DEXTROSE 2-3 GM-% IV SOLR
2.0000 g | INTRAVENOUS | Status: AC
  Administered 2012-12-22: 2 g via INTRAVENOUS
  Filled 2012-12-21: qty 50

## 2012-12-21 NOTE — Progress Notes (Signed)
Spoke with Benjaman Kindler, pt nurse regarding SDW assessment. According to nurse, pt has not had an EKG, chest x ray, echo, stress test, or cardiac catheterization. Nurse denies that patient has any cardiac history, is being treated  by a cardiologist, and has an ICD or pacemaker.

## 2012-12-21 NOTE — H&P (Signed)
Patient ID:                [id number] Patient:                     Steve Sloan                                        Date of Birth:   Jan 15, 1946 Visit Type:                Office Visit                                                         Date:   12/07/2012 11:30 AM Provider:                  Danae Orleans. Venetia Maxon Historian:                   self   This 67 year old male presents for Tremor.   HISTORY OF PRESENT ILLNESS: 1.  Tremor  Patient is here today because he has a deep brain stimulator to help with the tremors associated with Parkinson's disease and he needs the implant examined.         20+ year Hx Parkinsons. Right brain DBS implanted ~3yrs ago. Last known functioning of DBS per pt was 4-5 yrs ago...but it helped a great deal per pt. Saw Dr. Vickey Huger in May & IPG found to be depleted.     [Will disregard automated BP reading of 87/S in pt with significant LUE tremor]   Patient is currently at [facility] and comes in today with an attendant.  He is wheelchair-bound and speaks with a dysarthric voice and has a lot of difficulty getting up from a seated position or ambulating.  Through [facility] he is under the care of Dr. Leanord Hawking and has had some problems with diarrheal stools recently.  He also suffers from protein calorie malnutrition, and intestinal fistula, celiac disease, previous senile dementia with delusional features, unspecified iron deficiency anemia, and delusional disorder.  Recent lipid studies and TSH were reviewed which are normal.   I have also reviewed Dr. Oliva Bustard notes from an appointment in May regarding medication usage as well as his implantable deep brain stimulator device.  He has been on amantadine 100 mg daily Sinemet 25/104 times daily in addition to Questran Lomotil.  At the time of his visit with her she told him to be awake alert and conversant but with nonspontaneous non-fluid and dysarthric speech with dysphonia and all lethargy.    While I do not have operative records from his prior surgery he has a unilateral device on the right side of his head and an implanted pulse generator and the right upper chest from which they were unable to get any indication of electrical function.  The patient states that he is found the device to be extremely helpful but that the battery stopped working and he has not been able to use the device.  He is interested in having this device replaced so that he is able to use the electrical stimulation once again.       FAMILY HISTORY Patient reports  there is no relevant family history.     Medications (added, continued or stopped this visit):   Medication Dose Prescribed Else Ind Started Stopped Akwa Tears (polyvinyl alcohol) 1.4 % eye drops 1.4 % Y     amantadine HCl 100 mg capsule 100 mg Y     Florastor 250 mg capsule 250 mg Y     Imodium A-D 2 mg tablet 2 mg Y     Questran 4 gram powder for susp in a packet 4 gram Y     Sinemet CR 50 mg-200 mg tablet,extended release 50 mg-200 mg Y     Vigamox 0.5 % eye drops 0.5 % Y     Zirgan 0.15 % eye gel 0.15 % Y           Allergies:   Ingredient Reaction Medication Name Comment NO KNOWN ALLERGIES       No known allergies.   REVIEW OF SYSTEMS: System Neg/Pos Details Constitutional Negative Chills, fatigue, fever, malaise, night sweats, weight gain and weight loss. ENMT Negative Ear drainage, hearing loss, nasal drainage, otalgia, sinus pressure and sore throat. Eyes Negative Eye pain and vision changes. Eyes Positive Eye discharge. Respiratory Negative Chronic cough, cough, dyspnea, known TB exposure and wheezing. Cardio Negative Chest pain, claudication, edema and irregular heartbeat/palpitations. GI Negative Abdominal pain, blood in stool, change in stool pattern, constipation, decreased appetite, diarrhea, heartburn, nausea and vomiting. GU Negative Dribbling, dysuria,  erectile dysfunction, hematuria, polyuria, slow stream, urinary frequency, urinary incontinence and urinary retention. Endocrine Negative Cold intolerance, heat intolerance, polydipsia and polyphagia. Neuro Positive Extremity weakness, Gait disturbance, Tremors. Neuro Negative Dizziness, headache, memory impairment, numbness in extremities and seizures. Psych Negative Anxiety, depression and insomnia. Integumentary Negative Brittle hair, brittle nails, change in shape/size of mole(s), hair loss, hirsutism, hives, pruritus, rash and skin lesion. MS Negative Back pain, joint pain, joint swelling, muscle weakness and neck pain. Hema/Lymph Negative Easy bleeding, easy bruising and lymphadenopathy. Allergic/Immuno Negative Contact allergy, environmental allergies, food allergies and seasonal allergies. Reproductive Negative Penile discharge and sexual dysfunction.     Vitals Date Temp F BP Pulse Ht In Wt Lb BMI BSA Pain Score 12/07/2012   102/58 70           12/07/2012   87/53 67 75 186 23.25   0/10                                                                                                                     PHYSICAL EXAM General Level of Distress:             no acute distress Overall Appearance:        normal   Head and Face                                                 Right  Left                        Fundoscopic Exam:        normal                                   normal       Cardiovascular Cardiac:                                regular rate and rhythm without murmur                                                 Right                                     Left                Carotid Pulses:                  normal                                   normal   Respiratory Lungs:                                   clear to auscultation   Neurological Recent and Remote Memory:                       normal Attention Span and Concentration:           normal Language:                                                           non-fluent speech Fund of Knowledge:                                        normal                                                                                 Right                                     Left Sensation:  normal                                   normal Upper Extremity Coordination:                    normal                                   normal   Lower Extremity Coordination:                    normal                                   normal   Musculoskeletal Gait and Station:                                               normal                                                                                 Right                                     Left Upper Extremity Muscle Strength:             normal                                   normal Lower Extremity Muscle Strength:              normal                                   normal Upper Extremity Muscle Tone:                    increased with cowheeling             increased with cowheeling Lower Extremity Muscle Tone:                     increased                             increased   Motor Strength Upper and lower extremity motor strength was tested in the clinically pertinent muscles.     Deep Tendon Reflexes                                                 Right  Left Biceps:                                 normal                                   normal Triceps:                                normal                                   normal Brachiloradialis:                 normal                                   normal Patellar:                                normal                                   normal Achilles:                               normal                                    normal   Cranial Nerves II. Optic Nerve/Visual Fields:     normal III. Oculomotor:                              normal IV. Trochlear:                                   normal V. Trigeminal:                                  normal VI. Abducens:                                 normal VII. Facial:                                        normal VIII. Acoustic/Vestibular:            normal IX. Glossopharyngeal:                 normal X. Vagus:                                         normal XI. Spinal  Accessory:                  normal XII. Hypoglossal:                           normal   Motor and other Tests Lhermittes:                          negative Rhomberg:                          negative Pronator drift:                    absent                                                                                                   Right                                     Left Hoffman's:                          normal                                   normal Clonus:                                 normal                                   normal Babinski:                              normal                                   normal     Additional Findings:   Patient in wheelchair.  Gait not tested.  Hypophonic speech.  Intact DBS per radiographs and healed incisions.           IMPRESSION Right sided deep brain stimulator with depleted implantable pulse generator.  The patient has found this to be very helpful in the past and it helped to manage his Parkinson's disease better.  He is interested in having the device replaced.   Assessment/Plan # Detail Type Description 1. Assessment Parkinson disease (332.0).           I think this is very reasonable and have proposed that we go ahead and replace the implantable pulse generator.  The patient will then go back to Dr. Vickey Huger who will continue to manage his Parkinson's disease  and his medication usage.    Orders: Diagnostic Procedures: Assessment Procedure 332.0 IPG Revision                Provider:  Danae Orleans. Venetia Maxon  12/11/2012 02:06 PM Dictation edited by: Danae Orleans. Venetia Maxon

## 2012-12-22 ENCOUNTER — Inpatient Hospital Stay (HOSPITAL_COMMUNITY): Payer: Medicare Other

## 2012-12-22 ENCOUNTER — Inpatient Hospital Stay (HOSPITAL_COMMUNITY): Payer: Medicare Other | Admitting: Certified Registered Nurse Anesthetist

## 2012-12-22 ENCOUNTER — Ambulatory Visit (HOSPITAL_COMMUNITY)
Admission: RE | Admit: 2012-12-22 | Discharge: 2012-12-22 | DRG: 041 | Disposition: A | Discharge status: Skilled Nursing Facility | Payer: Medicare Other | Source: Ambulatory Visit | Attending: Neurosurgery | Admitting: Neurosurgery

## 2012-12-22 ENCOUNTER — Encounter (HOSPITAL_COMMUNITY)
Admission: RE | Disposition: A | Discharge status: Skilled Nursing Facility | Payer: Self-pay | Source: Ambulatory Visit | Attending: Neurosurgery

## 2012-12-22 ENCOUNTER — Encounter (HOSPITAL_COMMUNITY): Payer: Self-pay | Admitting: Certified Registered Nurse Anesthetist

## 2012-12-22 DIAGNOSIS — Z993 Dependence on wheelchair: Secondary | ICD-10-CM | POA: Insufficient documentation

## 2012-12-22 DIAGNOSIS — R471 Dysarthria and anarthria: Secondary | ICD-10-CM | POA: Insufficient documentation

## 2012-12-22 DIAGNOSIS — Z79899 Other long term (current) drug therapy: Secondary | ICD-10-CM | POA: Insufficient documentation

## 2012-12-22 DIAGNOSIS — K632 Fistula of intestine: Secondary | ICD-10-CM | POA: Insufficient documentation

## 2012-12-22 DIAGNOSIS — K900 Celiac disease: Secondary | ICD-10-CM | POA: Insufficient documentation

## 2012-12-22 DIAGNOSIS — E46 Unspecified protein-calorie malnutrition: Secondary | ICD-10-CM | POA: Insufficient documentation

## 2012-12-22 DIAGNOSIS — Z462 Encounter for fitting and adjustment of other devices related to nervous system and special senses: Secondary | ICD-10-CM | POA: Insufficient documentation

## 2012-12-22 DIAGNOSIS — F0280 Dementia in other diseases classified elsewhere without behavioral disturbance: Secondary | ICD-10-CM | POA: Insufficient documentation

## 2012-12-22 HISTORY — PX: SUBTHALAMIC STIMULATOR BATTERY REPLACEMENT: SHX5405

## 2012-12-22 LAB — CBC
Platelets: 127 10*3/uL — ABNORMAL LOW (ref 150–400)
RDW: 15.1 % (ref 11.5–15.5)
WBC: 3.5 10*3/uL — ABNORMAL LOW (ref 4.0–10.5)

## 2012-12-22 LAB — BASIC METABOLIC PANEL
Calcium: 8.8 mg/dL (ref 8.4–10.5)
Creatinine, Ser: 0.81 mg/dL (ref 0.50–1.35)
GFR calc Af Amer: >90 mL/min (ref >90)
GFR calc non Af Amer: 89 mL/min — ABNORMAL LOW (ref >90)

## 2012-12-22 SURGERY — SUBTHALAMIC STIMULATOR BATTERY REPLACEMENT
Anesthesia: General | Site: Chest | Wound class: Clean

## 2012-12-22 MED ORDER — OXYCODONE HCL 5 MG PO TABS: 5.0000 mg | ORAL_TABLET | Freq: Once | ORAL | Status: DC | PRN

## 2012-12-22 MED ORDER — BUPIVACAINE HCL (PF) 0.5 % IJ SOLN
INTRAMUSCULAR | Status: DC | PRN
  Administered 2012-12-22: 7.5 mL

## 2012-12-22 MED ORDER — LACTATED RINGERS IV SOLN
INTRAVENOUS | Status: DC
  Administered 2012-12-22: 10:00:00 via INTRAVENOUS

## 2012-12-22 MED ORDER — 0.9 % SODIUM CHLORIDE (POUR BTL) OPTIME
TOPICAL | Status: DC | PRN
  Administered 2012-12-22: 1000 mL

## 2012-12-22 MED ORDER — LACTATED RINGERS IV SOLN
INTRAVENOUS | Status: DC | PRN
  Administered 2012-12-22: 13:00:00 via INTRAVENOUS

## 2012-12-22 MED ORDER — VANCOMYCIN HCL 1000 MG IV SOLR
INTRAVENOUS | Status: DC | PRN
  Administered 2012-12-22: 1000 mg

## 2012-12-22 MED ORDER — FENTANYL CITRATE 0.05 MG/ML IJ SOLN: 25.0000 ug | INTRAMUSCULAR | Status: DC | PRN

## 2012-12-22 MED ORDER — MEPERIDINE HCL 25 MG/ML IJ SOLN: 6.2500 mg | INTRAMUSCULAR | Status: DC | PRN

## 2012-12-22 MED ORDER — LIDOCAINE-EPINEPHRINE 1 %-1:100000 IJ SOLN
INTRAMUSCULAR | Status: DC | PRN
  Administered 2012-12-22: 7.5 mL

## 2012-12-22 MED ORDER — VANCOMYCIN HCL 1000 MG IV SOLR
INTRAVENOUS | Status: AC
  Filled 2012-12-22: qty 1000

## 2012-12-22 MED ORDER — MIDAZOLAM HCL 2 MG/2ML IJ SOLN: 0.5000 mg | Freq: Once | INTRAMUSCULAR | Status: DC | PRN

## 2012-12-22 MED ORDER — PROMETHAZINE HCL 25 MG/ML IJ SOLN: 6.2500 mg | INTRAMUSCULAR | Status: DC | PRN

## 2012-12-22 MED ORDER — FENTANYL CITRATE 0.05 MG/ML IJ SOLN
INTRAMUSCULAR | Status: DC | PRN
  Administered 2012-12-22 (×6): 12.5 ug via INTRAVENOUS

## 2012-12-22 MED ORDER — OXYCODONE HCL 5 MG/5ML PO SOLN: 5.0000 mg | Freq: Once | ORAL | Status: DC | PRN

## 2012-12-22 SURGICAL SUPPLY — 58 items
BAG DECANTER FOR FLEXI CONT (MISCELLANEOUS) ×2 IMPLANT
BLADE SURG 10 STRL SS (BLADE) ×2 IMPLANT
BLADE SURG 15 STRL LF DISP TIS (BLADE) ×1 IMPLANT
BLADE SURG 15 STRL SS (BLADE) ×1
BOOT SUTURE AID YELLOW STND (SUTURE) ×2 IMPLANT
CANISTER SUCTION 2500CC (MISCELLANEOUS) ×2 IMPLANT
CLOTH BEACON ORANGE TIMEOUT ST (SAFETY) ×2 IMPLANT
CORDS BIPOLAR (ELECTRODE) ×2 IMPLANT
DERMABOND ADVANCED (GAUZE/BANDAGES/DRESSINGS) ×1
DERMABOND ADVANCED .7 DNX12 (GAUZE/BANDAGES/DRESSINGS) ×1 IMPLANT
DRAPE INCISE IOBAN 66X45 STRL (DRAPES) ×2 IMPLANT
DRAPE LAPAROTOMY 100X72 PEDS (DRAPES) ×2 IMPLANT
DRAPE POUCH INSTRU U-SHP 10X18 (DRAPES) ×2 IMPLANT
DRESSING TELFA 8X3 (GAUZE/BANDAGES/DRESSINGS) ×2 IMPLANT
DRSG OPSITE 4X5.5 SM (GAUZE/BANDAGES/DRESSINGS) IMPLANT
DRSG OPSITE POSTOP 4X6 (GAUZE/BANDAGES/DRESSINGS) ×2 IMPLANT
DURAPREP 26ML APPLICATOR (WOUND CARE) ×2 IMPLANT
ELECT CAUTERY BLADE 6.4 (BLADE) ×2 IMPLANT
GAUZE SPONGE 4X4 16PLY XRAY LF (GAUZE/BANDAGES/DRESSINGS) ×2 IMPLANT
GLOVE BIO SURGEON STRL SZ8 (GLOVE) ×2 IMPLANT
GLOVE BIOGEL PI IND STRL 8 (GLOVE) ×1 IMPLANT
GLOVE BIOGEL PI IND STRL 8.5 (GLOVE) ×1 IMPLANT
GLOVE BIOGEL PI INDICATOR 8 (GLOVE) ×1
GLOVE BIOGEL PI INDICATOR 8.5 (GLOVE) ×1
GLOVE ECLIPSE 7.5 STRL STRAW (GLOVE) ×2 IMPLANT
GLOVE EXAM NITRILE LRG STRL (GLOVE) IMPLANT
GLOVE EXAM NITRILE MD LF STRL (GLOVE) IMPLANT
GLOVE EXAM NITRILE XL STR (GLOVE) IMPLANT
GLOVE EXAM NITRILE XS STR PU (GLOVE) IMPLANT
GOWN BRE IMP SLV AUR LG STRL (GOWN DISPOSABLE) IMPLANT
GOWN BRE IMP SLV AUR XL STRL (GOWN DISPOSABLE) ×2 IMPLANT
GOWN STRL REIN 2XL LVL4 (GOWN DISPOSABLE) ×2 IMPLANT
KIT BASIN OR (CUSTOM PROCEDURE TRAY) ×2 IMPLANT
KIT ROOM TURNOVER OR (KITS) ×2 IMPLANT
MARKER SKIN DUAL TIP RULER LAB (MISCELLANEOUS) ×2 IMPLANT
NEEDLE HYPO 25X1 1.5 SAFETY (NEEDLE) ×2 IMPLANT
NEUROSTIM OCTOPOLAR ~~LOC~~ 2.4X2.2 (Neuro Prosthesis/Implant) ×2 IMPLANT
NEUROSTIM PROGRAMMER 2.2X3.7 (Neuro Prosthesis/Implant) ×2 IMPLANT
NS IRRIG 1000ML POUR BTL (IV SOLUTION) ×2 IMPLANT
PACK SURGICAL SETUP 50X90 (CUSTOM PROCEDURE TRAY) ×2 IMPLANT
PAD ARMBOARD 7.5X6 YLW CONV (MISCELLANEOUS) ×6 IMPLANT
PATIENT PROGRAMMER ×2 IMPLANT
PENCIL BUTTON HOLSTER BLD 10FT (ELECTRODE) ×2 IMPLANT
SPONGE GAUZE 4X4 12PLY (GAUZE/BANDAGES/DRESSINGS) ×2 IMPLANT
STAPLER SKIN PROX WIDE 3.9 (STAPLE) ×2 IMPLANT
STOCKINETTE 4X48 STRL (DRAPES) ×2 IMPLANT
STRIP CLOSURE SKIN 1/2X4 (GAUZE/BANDAGES/DRESSINGS) ×2 IMPLANT
SUT ETHILON 3 0 PS 1 (SUTURE) ×4 IMPLANT
SUT SILK 2 0 TIES 10X30 (SUTURE) ×2 IMPLANT
SUT VIC AB 2-0 CP2 18 (SUTURE) ×2 IMPLANT
SUT VIC AB 3-0 SH 8-18 (SUTURE) ×2 IMPLANT
SYR BULB 3OZ (MISCELLANEOUS) ×2 IMPLANT
SYR CONTROL 10ML LL (SYRINGE) ×2 IMPLANT
TOWEL OR 17X24 6PK STRL BLUE (TOWEL DISPOSABLE) ×2 IMPLANT
TOWEL OR 17X26 10 PK STRL BLUE (TOWEL DISPOSABLE) ×2 IMPLANT
TUBE CONNECTING 12X1/4 (SUCTIONS) ×2 IMPLANT
UNDERPAD 30X30 INCONTINENT (UNDERPADS AND DIAPERS) ×2 IMPLANT
WATER STERILE IRR 1000ML POUR (IV SOLUTION) ×2 IMPLANT

## 2012-12-22 NOTE — Transfer of Care (Signed)
Immediate Anesthesia Transfer of Care Note  Patient: Steve Sloan  Procedure(s) Performed: Procedure(s): Deep brain stimulator battery change (N/A)  Patient Location: PACU  Anesthesia Type:MAC  Level of Consciousness: awake, alert  and oriented  Airway & Oxygen Therapy: Patient Spontanous Breathing  Post-op Assessment: Report given to PACU RN and Post -op Vital signs reviewed and stable  Post vital signs: Reviewed and stable  Complications: No apparent anesthesia complications

## 2012-12-22 NOTE — Brief Op Note (Signed)
12/22/2012  1:43 PM  PATIENT:  Steve Sloan  67 y.o. male  PRE-OPERATIVE DIAGNOSIS:  Parkinson Disease  POST-OPERATIVE DIAGNOSIS:  Parkinson Disease  PROCEDURE:  Procedure(s): Deep brain stimulator battery change (N/A)  SURGEON:  Surgeon(s) and Role:    * Maeola Harman, MD - Primary  PHYSICIAN ASSISTANT:   ASSISTANTS: none   ANESTHESIA:   IV sedation  EBL:  Total I/O In: 400 [I.V.:400] Out: -   BLOOD ADMINISTERED:none  DRAINS: none   LOCAL MEDICATIONS USED:  LIDOCAINE   SPECIMEN:  No Specimen  DISPOSITION OF SPECIMEN:  N/A  COUNTS:  YES  TOURNIQUET:  * No tourniquets in log *  DICTATION: DICTATION: Patient has implanted subthalamic stimulator electrodes and IPG, which is now depleted.  It was elected for patient to undergo IPG revision.  PROCEDURE: Patient was brought to the operating room and given intravenous sedation.  Right upper chest was prepped with betadine scrub and Duraprep.  Area of planned incision was infiltrated with lidocaine.  Prior incision was reopened and the old IPG was externalized.  Adaptor was connected to new IPG which was placed in the pocket.  Wound was irrigated with vancomycin. Then irrigated once more.  Incision was closed with 2-0 Vicryl and 3-0 vicryl sutures and dressed with a sterile occlusive dressing.  Counts were correct at the end of the case.  PLAN OF CARE: Discharge to home after PACU  PATIENT DISPOSITION:  PACU - hemodynamically stable.   Delay start of Pharmacological VTE agent (>24hrs) due to surgical blood loss or risk of bleeding: yes

## 2012-12-22 NOTE — Op Note (Signed)
12/22/2012  1:43 PM  PATIENT:  Steve Sloan  67 y.o. male  PRE-OPERATIVE DIAGNOSIS:  Parkinson Disease  POST-OPERATIVE DIAGNOSIS:  Parkinson Disease  PROCEDURE:  Procedure(s): Deep brain stimulator battery change (N/A)  SURGEON:  Surgeon(s) and Role:    * Bertice Risse, MD - Primary  PHYSICIAN ASSISTANT:   ASSISTANTS: none   ANESTHESIA:   IV sedation  EBL:  Total I/O In: 400 [I.V.:400] Out: -   BLOOD ADMINISTERED:none  DRAINS: none   LOCAL MEDICATIONS USED:  LIDOCAINE   SPECIMEN:  No Specimen  DISPOSITION OF SPECIMEN:  N/A  COUNTS:  YES  TOURNIQUET:  * No tourniquets in log *  DICTATION: DICTATION: Patient has implanted subthalamic stimulator electrodes and IPG, which is now depleted.  It was elected for patient to undergo IPG revision.  PROCEDURE: Patient was brought to the operating room and given intravenous sedation.  Right upper chest was prepped with betadine scrub and Duraprep.  Area of planned incision was infiltrated with lidocaine.  Prior incision was reopened and the old IPG was externalized.  Adaptor was connected to new IPG which was placed in the pocket.  Wound was irrigated with vancomycin. Then irrigated once more.  Incision was closed with 2-0 Vicryl and 3-0 vicryl sutures and dressed with a sterile occlusive dressing.  Counts were correct at the end of the case.  PLAN OF CARE: Discharge to home after PACU  PATIENT DISPOSITION:  PACU - hemodynamically stable.   Delay start of Pharmacological VTE agent (>24hrs) due to surgical blood loss or risk of bleeding: yes  

## 2012-12-22 NOTE — Progress Notes (Signed)
Deep Brain stimulator turned on and adjusted per [employer] tech

## 2012-12-22 NOTE — Progress Notes (Signed)
Pt discharged with caregiver, and his POA.  [facility] RN Jasmine December has called report to nsg home, and provided d/c instructins.  Pt stable at this time

## 2012-12-22 NOTE — Preoperative (Signed)
Beta Blockers   Reason not to administer Beta Blockers:Not Applicable 

## 2012-12-22 NOTE — Interval H&P Note (Signed)
History and Physical Interval Note:  12/22/2012 10:28 AM  Steve Sloan  has presented today for surgery, with the diagnosis of Parkinson Disease  The various methods of treatment have been discussed with the patient and family. After consideration of risks, benefits and other options for treatment, the patient has consented to  Procedure(s) with comments: SUBTHALAMIC STIMULATOR BATTERY REPLACEMENT (N/A) - Deep brain stimulator battery change as a surgical intervention .  The patient's history has been reviewed, patient examined, no change in status, stable for surgery.  I have reviewed the patient's chart and labs.  Questions were answered to the patient's satisfaction.     Esmeralda Malay D

## 2012-12-22 NOTE — Anesthesia Postprocedure Evaluation (Signed)
  Anesthesia Post-op Note  Patient: Steve Sloan  Procedure(s) Performed: Procedure(s): Deep brain stimulator battery change (N/A)  Patient Location: PACU  Anesthesia Type:MAC  Level of Consciousness: awake, alert , oriented and patient cooperative  Airway and Oxygen Therapy: Patient Spontanous Breathing  Post-op Pain: none  Post-op Assessment: Post-op Vital signs reviewed, Patient's Cardiovascular Status Stable, Respiratory Function Stable, Patent Airway, No signs of Nausea or vomiting and Pain level controlled  Post-op Vital Signs: Reviewed and stable  Complications: No apparent anesthesia complications

## 2012-12-22 NOTE — Anesthesia Procedure Notes (Signed)
Procedure Name: MAC Performed by: Margaree Mackintosh Pre-anesthesia Checklist: Patient identified, Timeout performed, Emergency Drugs available, Patient being monitored and Suction available Patient Re-evaluated:Patient Re-evaluated prior to inductionOxygen Delivery Method: Simple face mask Preoxygenation: Pre-oxygenation with 100% oxygen Placement Confirmation: positive ETCO2 and breath sounds checked- equal and bilateral Dental Injury: Teeth and Oropharynx as per pre-operative assessment

## 2012-12-22 NOTE — Anesthesia Preprocedure Evaluation (Signed)
Anesthesia Evaluation  Patient identified by MRN, date of birth, ID band Patient awake    Reviewed: Allergy & Precautions, H&P , NPO status , Patient's Chart, lab work & pertinent test results  History of Anesthesia Complications Negative for: history of anesthetic complications  Airway Mallampati: II TM Distance: >3 FB Neck ROM: Full    Dental  (+) Edentulous Upper and Partial Lower   Pulmonary neg pulmonary ROS,  breath sounds clear to auscultation  Pulmonary exam normal       Cardiovascular negative cardio ROS  Rhythm:Regular Rate:Normal     Neuro/Psych Severe Parkinson's: deep brain stimulator    GI/Hepatic negative GI ROS, Neg liver ROS,   Endo/Other  negative endocrine ROS  Renal/GU negative Renal ROS Bladder dysfunction      Musculoskeletal   Abdominal   Peds  Hematology  (+) Blood dyscrasia (Hb 11.1), anemia ,   Anesthesia Other Findings   Reproductive/Obstetrics                           Anesthesia Physical Anesthesia Plan  ASA: III  Anesthesia Plan: MAC   Post-op Pain Management:    Induction: Intravenous  Airway Management Planned: Simple Face Mask and Natural Airway  Additional Equipment:   Intra-op Plan:   Post-operative Plan:   Informed Consent: I have reviewed the patients History and Physical, chart, labs and discussed the procedure including the risks, benefits and alternatives for the proposed anesthesia with the patient or authorized representative who has indicated his/her understanding and acceptance.   Dental advisory given  Plan Discussed with: CRNA and Surgeon  Anesthesia Plan Comments: (Plan routine monitors, MAC)        Anesthesia Quick Evaluation

## 2012-12-22 NOTE — Progress Notes (Signed)
DR Venetia Maxon wishes to see pt in 2 weeks for F/U visit. May remove dsg in 5 days

## 2012-12-23 ENCOUNTER — Encounter (HOSPITAL_COMMUNITY): Payer: Self-pay | Admitting: Neurosurgery

## 2012-12-24 ENCOUNTER — Non-Acute Institutional Stay (SKILLED_NURSING_FACILITY): Payer: Medicare Other | Admitting: Internal Medicine

## 2012-12-24 DIAGNOSIS — G20 Parkinson's disease: Secondary | ICD-10-CM

## 2012-12-24 DIAGNOSIS — F22 Delusional disorders: Secondary | ICD-10-CM

## 2012-12-24 DIAGNOSIS — K900 Celiac disease: Secondary | ICD-10-CM

## 2012-12-24 NOTE — Progress Notes (Signed)
Patient ID: Steve Sloan, male   DOB: 31-Jul-1945, 67 y.o.   MRN: [id number]            PROGRESS NOTE  DATE:  12/24/2012  FACILITY: [facility]    LEVEL OF CARE:   SNF   Routine Visit   CHIEF COMPLAINT:  Routine [facility] visit for the month of July    HISTORY OF PRESENT ILLNESS:  Steve Sloan is a gentleman with multiple medical issues.  He has been in the facility since April 2012.    Most of his disability is secondary to Parkinson's disease.  He has a history of a deep brain stimulant implant.  He follows with Dr. Vickey Huger of Neurology.  He has been recently admitted for change in the battery. He is at high risk for falls. He signed a waiver last year releasing the facility to have a regular diet instead of a modified diet.  He is also followed by GI.  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  He, therefore, has liquid stools.   His stool has been heme-positive.  There was also some suggestion that he may have celiac disease although per Dr. Allena Katz, the original colectomy was secondary to a sigmoid volvulus.   The ileorectal anastomosis was in 2011.  It would appear that the celiac diagnosis has been confirmed by an elevated tissue  transglutaminase antibody.  He was started on a gluten-free diet at the facility.  The actual compliance from both the patient  is really unclear.    PHYSICAL EXAMINATION:   CHEST/RESPIRATORY:  Clear air entry bilaterally.   CARDIOVASCULAR:  CARDIAC:   Heart sounds are normal.  No murmurs.   GASTROINTESTINAL:  ABDOMEN:   Soft, nontender.  No masses are noted.   SKIN:  INSPECTION:  He has had a recent right temple biopsy by Dermatology, Dr. Terri Piedra.   MUSCULOSKELETAL:   GAIT/STATION:  Predominantly wheelchair, although he can walk short distances with a walker.   NEUROLOGICAL:   His rigidity is not badly controlled, although he is still quite bradykinetic.  He is on Sinemet CR, amantadine, the Sinemet at 50/200 q.i.d.    ASSESSMENT/PLAN:  Celiac disease.  Compliance with a gluten-free diet is really not that clear.    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     History of a delusional disorder.  On Seroquel.  Followed by Psychiatry.  The Seroquel increase would need to be carefully monitored given his Parkinson's disease.  Dopamine agents, including Sinemet, can cause hallucinations by themselves.    CPT CODE: 40981

## 2013-01-10 ENCOUNTER — Non-Acute Institutional Stay (SKILLED_NURSING_FACILITY): Payer: Medicare Other | Admitting: Internal Medicine

## 2013-01-10 DIAGNOSIS — K900 Celiac disease: Secondary | ICD-10-CM

## 2013-01-10 DIAGNOSIS — G20 Parkinson's disease: Secondary | ICD-10-CM

## 2013-01-10 NOTE — Progress Notes (Signed)
Patient ID: EURAL HOLZSCHUH, male   DOB: 11/03/1945, 67 y.o.   MRN: [id number]            PROGRESS NOTE  DATE:  01/10/2013   FACILITY: [facility]    LEVEL OF CARE:   SNF   Routine Visit   CHIEF COMPLAINT:  Routine [facility] visit for the month of August  HISTORY OF PRESENT ILLNESS:  Mr. Busta is a gentleman with multiple medical issues.  He has been in the facility since April 2012.    Most of his disability is secondary to Parkinson's disease.  He has a history of a deep brain stimulant implant.  He follows with Dr. Vickey Huger of Neurology.  He has been recently admitted for change in the battery. He is at high risk for falls. He signed a waiver last year releasing the facility to have a regular diet instead of a modified diet.  He is also followed by GI.  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  He, therefore, has liquid stools.   His stool has been heme-positive.  There was also some suggestion that he may have celiac disease although per Dr. Allena Katz, the original colectomy was secondary to a sigmoid volvulus.   The ileorectal anastomosis was in 2011.  It would appear that the celiac diagnosis has been confirmed by an elevated tissue  transglutaminase antibody.  He was started on a gluten-free diet at the facility.  The actual compliance from both the patient  is really unclear.    PHYSICAL EXAMINATION:   CHEST/RESPIRATORY:  Clear air entry bilaterally.   CARDIOVASCULAR:  CARDIAC:   Heart sounds are normal.  No murmurs.   GASTROINTESTINAL:  ABDOMEN:   Soft, nontender.  No masses are noted.   SKIN:  INSPECTION:  He has had a recent right temple biopsy by Dermatology, Dr. Terri Piedra.   MUSCULOSKELETAL:   GAIT/STATION:  Predominantly wheelchair, although he can walk short distances with a walker.   NEUROLOGICAL:   His rigidity is not badly controlled, although he is still quite bradykinetic.  He is on Sinemet CR, amantadine, the Sinemet at 50/200 q.i.d.    ASSESSMENT/PLAN:  Celiac disease.  Compliance with a gluten-free diet is really not that clear.    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     History of a delusional disorder.  On Seroquel.  Followed by Psychiatry.  The Seroquel increase would need to be carefully monitored given his Parkinson's disease.  Dopamine agents, including Sinemet, can cause hallucinations by themselves.

## 2013-02-22 ENCOUNTER — Non-Acute Institutional Stay (SKILLED_NURSING_FACILITY): Payer: Medicare Other | Admitting: Internal Medicine

## 2013-02-22 DIAGNOSIS — G20 Parkinson's disease: Secondary | ICD-10-CM

## 2013-02-22 NOTE — Progress Notes (Signed)
Patient ID: Steve Sloan, male   DOB: 08/14/1945, 67 y.o.   MRN: [id number]            PROGRESS NOTE  DATE:  02/22/2013  FACILITY: [facility]    LEVEL OF CARE:   SNF   Routine Visit   CHIEF COMPLAINT:  Routine [facility] visit for the month of September  HISTORY OF PRESENT ILLNESS:  [name] is a gentleman with multiple medical issues.  He has been in the facility since April 2012.    Most of his disability is secondary to Parkinson's disease.  He has a history of a deep brain stimulant implant.  He follows with Dr. Vickey Huger of Neurology.  He has been recently admitted for change in the battery. He is at high risk for falls. He signed a waiver last year releasing the facility to have a regular diet instead of a modified diet.  He is also followed by GI.  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  He, therefore, has liquid stools.   His stool has been heme-positive.  There was also some suggestion that he may have celiac disease although per Dr. Allena Katz, the original colectomy was secondary to a sigmoid volvulus.   The ileorectal anastomosis was in 2011.  It would appear that the celiac diagnosis has been confirmed by an elevated tissue  transglutaminase antibody.  He was started on a gluten-free diet at the facility.  The actual compliance from both the patient  is really unclear.    PHYSICAL EXAMINATION:   CHEST/RESPIRATORY:  Clear air entry bilaterally.   CARDIOVASCULAR:  CARDIAC:   Heart sounds are normal.  No murmurs.   GASTROINTESTINAL:  ABDOMEN:   Soft, nontender.  No masses are noted.   SKIN:  INSPECTION:  He has had a recent right temple biopsy by Dermatology, Dr. Terri Piedra.   MUSCULOSKELETAL:   GAIT/STATION:  Predominantly wheelchair, although he can walk short distances with a walker.   NEUROLOGICAL:   His rigidity is not badly controlled, although he is still quite bradykinetic.  He is on Sinemet CR, amantadine, the Sinemet at 50/200 q.i.d.    ASSESSMENT/PLAN:  Celiac disease.  Compliance with a gluten-free diet is really not that clear.    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     History of a delusional disorder.  On Seroquel.  Followed by Psychiatry.  The Seroquel increase would need to be carefully monitored given his Parkinson's disease.  Dopamine agents, including Sinemet, can cause hallucinations by themselves.    Parkinson's disease; the battery on his deep brain stimulator has recently been changed and he apparently is improving

## 2013-03-08 ENCOUNTER — Other Ambulatory Visit: Payer: Self-pay | Admitting: Neurology

## 2013-03-08 ENCOUNTER — Encounter (INDEPENDENT_AMBULATORY_CARE_PROVIDER_SITE_OTHER): Payer: Self-pay

## 2013-03-08 ENCOUNTER — Encounter: Payer: Self-pay | Admitting: Neurology

## 2013-03-08 ENCOUNTER — Ambulatory Visit (INDEPENDENT_AMBULATORY_CARE_PROVIDER_SITE_OTHER): Payer: Medicare Other | Admitting: Neurology

## 2013-03-08 VITALS — BP 68/48 | HR 60 | Resp 18 | Ht 70.0 in | Wt 172.0 lb

## 2013-03-08 DIAGNOSIS — Z9889 Other specified postprocedural states: Secondary | ICD-10-CM

## 2013-03-08 DIAGNOSIS — G20 Parkinson's disease: Secondary | ICD-10-CM | POA: Insufficient documentation

## 2013-03-08 DIAGNOSIS — G20A1 Parkinson's disease without dyskinesia, without mention of fluctuations: Secondary | ICD-10-CM | POA: Insufficient documentation

## 2013-03-08 DIAGNOSIS — K909 Intestinal malabsorption, unspecified: Secondary | ICD-10-CM

## 2013-03-08 DIAGNOSIS — T859XXA Unspecified complication of internal prosthetic device, implant and graft, initial encounter: Secondary | ICD-10-CM

## 2013-03-08 DIAGNOSIS — T859XXS Unspecified complication of internal prosthetic device, implant and graft, sequela: Secondary | ICD-10-CM

## 2013-03-08 HISTORY — DX: Unspecified complication of internal prosthetic device, implant and graft, initial encounter: T85.9XXA

## 2013-03-08 HISTORY — DX: Other specified postprocedural states: Z98.890

## 2013-03-08 MED ORDER — CARBIDOPA-LEVODOPA 25-250 MG PO TABS: 1.0000 | ORAL_TABLET | Freq: Four times a day (QID) | ORAL | Status: DC

## 2013-03-08 MED ORDER — ENTACAPONE 200 MG PO TABS: 200.0000 mg | ORAL_TABLET | Freq: Three times a day (TID) | ORAL | Status: DC

## 2013-03-08 NOTE — Addendum Note (Signed)
Addended by: Melvyn Novas on: 03/08/2013 02:48 PM   Modules accepted: Orders

## 2013-03-08 NOTE — Telephone Encounter (Signed)
Pt's prescription was faxed over to [facility] at [phone].

## 2013-03-08 NOTE — Patient Instructions (Signed)

## 2013-03-08 NOTE — Progress Notes (Addendum)
Chief Complaint  Patient presents with  . Tremors    6 MO F/U   [name]  Provider:  Dr Jaycee Pelzer Referring Provider: Maxwell Caul, MD Primary Care Physician:  Terald Sleeper, MD  Chief Complaint  Patient presents with  . Tremors    6 MO F/U    HPI:  Steve Sloan is a 67 y.o. male here as a referral from Dr. Leanord Hawking. He has seen Dr. Anne Hahn at [facility].  Established patient of Dr. Anne Hahn at [facility], and was last seen in 03/23/2012. The patient is a history of Parkinson's disease for over 20 years he has been followed through the [place] hospital system but is now residing in a nursing facility. The patient has a deep brain stimulator placement to the right side of the brain, but the original insertion date was not known at the time.   implanted  07/22/2001. The patient was  here with the [employer] representative  At his last visit. -to have this stimulator interrogated and to document settings.  His battery is depleted - it was found necessary for the patient to undergo a battery change operation. The patient also has the following symptoms as documented in 2013, eye opening apraxia, parkinsonian gait disturbance, drooling, parkinsonian dysphonia and this 5 year, he also has a history of short bowel syndrome following colon surgery.   The patient is on the following Parkinson medications #1 amantadine #2 Sinemet #3 Comtan.  The [employer] representative assumed that the battery must have stopped working over a ready 6 or 7 years ago, and we referred  [name] to Dr Venetia Maxon , for a battery change .  This was performed in August 2013.    Review of Systems: Out of a complete 14 system review, the patient complains of only the following symptoms, and all other reviewed systems are negative.   History   Social History  . Marital Status: Divorced    Spouse Name: N/A    Number of Children: 3  . Years of Education: N/A   Occupational  History  . disabled    Social History Main Topics  . Smoking status: Never Smoker   . Smokeless tobacco: Never Used  . Alcohol Use: No  . Drug Use: No  . Sexual Activity: Not on file   Other Topics Concern  . Not on file   Social History Narrative   He is retired from [employer].  Is working as a Agricultural consultant at the [employer] .   He has been married to his wife, Steve Sloan, for 57 years, with whom he has four children. All have their own families by now, all healthy.    Drinks coffee in the morning.    Denies alcohol, tobacco and drug use.    History reviewed. No pertinent family history.  Past Medical History  Diagnosis Date  . Parkinson's disease   . Hypotension   . Brain tumor   . Encounter for intubation   . Dysphagia   . Enterocutaneous fistula   . Chronic diarrhea   . Anemia   . Anal fistula   . Cancer   . Dementia     Past Surgical History  Procedure Laterality Date  . Pacemaker insertion    . Deep brain stimulator placement      with removal of brain cells  . Colectomy    . Subthalamic stimulator battery replacement N/A 12/22/2012    Procedure: Deep brain stimulator battery change;  Surgeon: Jomarie Longs  Venetia Maxon, MD;  Location: [facility];  Service: Neurosurgery;  Laterality: N/A;    Current Outpatient Prescriptions  Medication Sig Dispense Refill  . amantadine (SYMMETREL) 100 MG capsule Take 100 mg by mouth daily.      . carbidopa-levodopa (SINEMET CR) 50-200 MG per tablet Take 1 tablet by mouth 4 (four) times daily. 9am, 1pm, 5pm, 9pm      . Carboxymethylcellul-Glycerin (OPTIVE SENSITIVE) 0.5-0.9 % SOLN Place 1 drop into both eyes 2 (two) times daily.      . cholestyramine (QUESTRAN) 4 G packet Take 1 packet by mouth 2 (two) times daily with a meal.      . diphenoxylate-atropine (LOMOTIL) 2.5-0.025 MG per tablet Take 2 tablets by mouth 2 (two) times daily.      . ferrous sulfate 325 (65 FE) MG tablet Take 325 mg by mouth 2 (two) times daily with a meal.        . hydroxypropyl methylcellulose (ISOPTO TEARS) 2.5 % ophthalmic solution Place 1 drop into both eyes 2 (two) times daily.      . QUEtiapine (SEROQUEL) 25 MG tablet Take 12.5 mg by mouth 2 (two) times daily.       Marland Kitchen saccharomyces boulardii (FLORASTOR) 250 MG capsule Take 250 mg by mouth daily.      Marland Kitchen tobramycin-dexamethasone (TOBRADEX) ophthalmic ointment Place 1 application into both eyes 2 (two) times daily.      . Vitamin D, Ergocalciferol, (DRISDOL) 50000 UNITS CAPS capsule Take 50,000 Units by mouth every 30 (thirty) days. On the 25th      . loperamide (IMODIUM) 2 MG capsule Take 2 mg by mouth 2 (two) times daily.       No current facility-administered medications for this visit.    Allergies as of 03/08/2013 - Review Complete 03/08/2013  Allergen Reaction Noted  . Donepezil  08/30/2012    Vitals: BP 68/48  Pulse 60  Resp 18  Ht 5\' 10"  (1.778 m)  Wt 172 lb (78.019 kg)  BMI 24.68 kg/m2 Last Weight:  Wt Readings from Last 1 Encounters:  03/08/13 172 lb (78.019 kg)   Last Height:   Ht Readings from Last 1 Encounters:  03/08/13 5\' 10"  (1.778 m)   Vision Screening:  See vitals . Physical exam: The patient demonstrates eye opening apraxia, but is able to him for direct questions. He appears not in acute distress. He is slender and groomed.  His facial skull shows asymmetry, the patient lost the left eyebrow in what he described as a skin cancer surgery. He has normal eye movements. Evident is that the patient is rigid in all 4 extremities and that passive movement demonstrates limited range of movement as well as cogwheel rigidity although biceps triceps wrist hip and knee movements. The patient said in a wheelchair during this evaluation. I cannot see a preference or dominance of Parkinson syndrome symptoms on left versus  right side -his  deep in the deep brain stimulator was implanted to the right brain.  Cardiovascular:  Regular rate and rhythm , without  murmurs or  carotid bruit, and without distended neck veins. Respiratory: Lungs are clear to auscultation. Skin:  Without evidence of edema, or rash Trunk: BMI is  elevated and patient  has normal posture.  Neurologic exam : The patient is awake and alert, oriented to place and time.  Memory subjective   described as intact. There is a normal attention span & concentration ability. Speech is non spontaneous , non  fluent with extreme  hoarseness, unable to modulate.    He has both dysarthria, dysphonia - Mood and affect appear depressed , lethargic.  Cranial nerves: Pupils are equal and briskly reactive to light. Funduscopic exam without  evidence of pallor or edema.  Extraocular movements  in vertical and horizontal planes intact and without nystagmus. Visual fields by finger perimetry are intact. Hearing to finger rub intact. Facial sensation intact to fine touch.  His left eye brow is missing, his left eyelid is ptotic, the temple retracted as is the orbit. He denies  Pain to palpation, denies jaw claudication.   He denies any eye pain, headaches, there is no vessel palpable. Patient repots having had skin cancer and radiation on the temple.  Motor exam:  Rigid muscle tone and tremor, bradykinesia.  Right grip is stronger than left . Overall left body weakness and rigor exceed the right.   Sensory:  Fine touch, pinprick and vibration were tested in all extremities.  Loss of vibration in left extremities .  Coordination: Rapid alternating movements in the fingers/hands is tested and abnormal with evidence of ataxia, dysmetria and tremor.  He cannot distinguish left and right ,and seems to be unaware of his left body ( hemi neglect)   Gait and station: deferred. He walked unassisted with minimal step width of 2-3 inches and shuffles, he has been unable to stand erect, is propulsive -  Propulsive and retropulsive trend is noted. He is unable to turn and was unable to step backwards, no axial reflexes were  preserved.   Considered wheelchair bound at his facility, as he falls often.     A: patient with PD and cognitive impairment, there is a long gap in his medical history : had he undergone a Moh's procedure on the left temple? The batteries of his DBS have been depleted over 7 years ago and he is now  Feeling no changes since the battery change was  performed.  Medication is given not regularly at the nursing facility but he denies freezing episodes, experiences no end of dose phenomenon.   Patient had seen Dr.  Venetia Maxon in august for battery change.  He lives at [facility] [place] .  Rv in 2 month with movement disorder specialist .

## 2013-03-14 ENCOUNTER — Telehealth: Payer: Self-pay | Admitting: Neurology

## 2013-03-14 NOTE — Telephone Encounter (Signed)
Jasmine December the patient's nurse at [facility] left a message that she needs someone to show her how to check his DBS with this machine she has.  I have left her message to get clarification on what machine she is referring to.  [phone]

## 2013-03-15 NOTE — Telephone Encounter (Signed)
This patient has a DBS - and there is not need for the facility to check on it- the batteries are set new. The setting is changed by MD and only with the rep from [employer].

## 2013-03-21 ENCOUNTER — Non-Acute Institutional Stay (SKILLED_NURSING_FACILITY): Payer: Medicare Other | Admitting: Internal Medicine

## 2013-03-21 DIAGNOSIS — K900 Celiac disease: Secondary | ICD-10-CM

## 2013-03-21 DIAGNOSIS — G20 Parkinson's disease: Secondary | ICD-10-CM

## 2013-03-21 NOTE — Telephone Encounter (Signed)
Spoke to Bobbye Charleston from [facility] and relayed the facility doesn't need to check on the DBS and this is done by MD and [employer] rep.  She expressed understanding.

## 2013-03-21 NOTE — Progress Notes (Signed)
Patient ID: Steve Sloan, male   DOB: [birth year], 67 y.o.   MRN: [id number]            PROGRESS NOTE  DATE:  03/21/2013  FAC[facility]: [facility]    LEVEL OF CARE:   SNF   Routine Visit   CHIEF COMPLAINT:  Routine [facility] visit for the month of September  HISTORY OF PRESENT ILLNESS:  Mr. Hail is a gentleman with multiple medical issues.  He has been in the facility since April 2012.    Most of his disability is secondary to Parkinson's disease.  He has a history of a deep brain stimulant implant.  He follows with Dr. Vickey Huger of Neurology.  He has been recently admitted for change in the battery. He is at high risk for falls. He signed a waiver last year releasing the facility to have a regular diet instead of a modified diet.  He is also followed by GI.  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  He, therefore, has liquid stools.   His stool has been heme-positive.  There was also some suggestion that he may have celiac disease although per Dr. Allena Katz, the original colectomy was secondary to a sigmoid volvulus.   The ileorectal anastomosis was in 2011.  It would appear that the celiac diagnosis has been confirmed by an elevated tissue  transglutaminase antibody.  He was started on a gluten-free diet at the facility.  The actual compliance from both the patient  is really unclear.    PHYSICAL EXAMINATION:   CHEST/RESPIRATORY:  Clear air entry bilaterally.   CARDIOVASCULAR:  CARDIAC:   Heart sounds are normal.  No murmurs.   GASTROINTESTINAL:  ABDOMEN:   Soft, nontender.  No masses are noted.   SKIN:  INSPECTION:  He has had a recent right temple biopsy by Dermatology, Dr. Terri Piedra.   MUSCULOSKELETAL:   GAIT/STATION:  Predominantly wheelchair, although he can walk short distances with a walker.   NEUROLOGICAL:   His rigidity is not badly controlled, although he is still quite bradykinetic.  He is on Sinemet CR, amantadine, the Sinemet at 50/200 q.i.d.    ASSESSMENT/PLAN:  Celiac disease.  Compliance with a gluten-free diet is really not that clear.    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     History of a delusional disorder.  On Seroquel.  Followed by Psychiatry.  The Seroquel increase would need to be carefully monitored given his Parkinson's disease.  Dopamine agents, including Sinemet, can cause hallucinations by themselves.    Parkinson's disease; the battery on his deep brain stimulator has recently been changed after being expired for years. No major change. He has cognitive impairment related to this as well

## 2013-04-04 ENCOUNTER — Emergency Department (HOSPITAL_COMMUNITY): Payer: Medicare Other

## 2013-04-04 ENCOUNTER — Inpatient Hospital Stay (HOSPITAL_COMMUNITY)
Admission: EM | Admit: 2013-04-04 | Discharge: 2013-04-07 | DRG: 682 | Disposition: A | Discharge status: Skilled Nursing Facility | Payer: Medicare Other | Attending: Family Medicine | Admitting: Family Medicine

## 2013-04-04 ENCOUNTER — Non-Acute Institutional Stay (SKILLED_NURSING_FACILITY): Payer: Medicare Other | Admitting: Internal Medicine

## 2013-04-04 ENCOUNTER — Encounter (HOSPITAL_COMMUNITY): Payer: Self-pay | Admitting: Emergency Medicine

## 2013-04-04 DIAGNOSIS — E860 Dehydration: Secondary | ICD-10-CM | POA: Diagnosis present

## 2013-04-04 DIAGNOSIS — F22 Delusional disorders: Secondary | ICD-10-CM | POA: Diagnosis present

## 2013-04-04 DIAGNOSIS — N12 Tubulo-interstitial nephritis, not specified as acute or chronic: Secondary | ICD-10-CM

## 2013-04-04 DIAGNOSIS — T40605A Adverse effect of unspecified narcotics, initial encounter: Secondary | ICD-10-CM | POA: Diagnosis not present

## 2013-04-04 DIAGNOSIS — G20 Parkinson's disease: Secondary | ICD-10-CM | POA: Diagnosis present

## 2013-04-04 DIAGNOSIS — C4330 Malignant melanoma of unspecified part of face: Secondary | ICD-10-CM | POA: Diagnosis present

## 2013-04-04 DIAGNOSIS — G929 Unspecified toxic encephalopathy: Secondary | ICD-10-CM | POA: Diagnosis not present

## 2013-04-04 DIAGNOSIS — K912 Postsurgical malabsorption, not elsewhere classified: Secondary | ICD-10-CM | POA: Diagnosis present

## 2013-04-04 DIAGNOSIS — N390 Urinary tract infection, site not specified: Secondary | ICD-10-CM | POA: Diagnosis present

## 2013-04-04 DIAGNOSIS — Z79899 Other long term (current) drug therapy: Secondary | ICD-10-CM

## 2013-04-04 DIAGNOSIS — R197 Diarrhea, unspecified: Secondary | ICD-10-CM | POA: Diagnosis present

## 2013-04-04 DIAGNOSIS — N19 Unspecified kidney failure: Secondary | ICD-10-CM

## 2013-04-04 DIAGNOSIS — E869 Volume depletion, unspecified: Secondary | ICD-10-CM

## 2013-04-04 DIAGNOSIS — N179 Acute kidney failure, unspecified: Principal | ICD-10-CM | POA: Diagnosis present

## 2013-04-04 DIAGNOSIS — Z950 Presence of cardiac pacemaker: Secondary | ICD-10-CM

## 2013-04-04 DIAGNOSIS — K900 Celiac disease: Secondary | ICD-10-CM

## 2013-04-04 DIAGNOSIS — T859XXA Unspecified complication of internal prosthetic device, implant and graft, initial encounter: Secondary | ICD-10-CM

## 2013-04-04 DIAGNOSIS — G20A1 Parkinson's disease without dyskinesia, without mention of fluctuations: Secondary | ICD-10-CM | POA: Diagnosis present

## 2013-04-04 DIAGNOSIS — E43 Unspecified severe protein-calorie malnutrition: Secondary | ICD-10-CM | POA: Insufficient documentation

## 2013-04-04 DIAGNOSIS — Z9049 Acquired absence of other specified parts of digestive tract: Secondary | ICD-10-CM

## 2013-04-04 DIAGNOSIS — Z681 Body mass index (BMI) 19 or less, adult: Secondary | ICD-10-CM

## 2013-04-04 DIAGNOSIS — G92 Toxic encephalopathy: Secondary | ICD-10-CM | POA: Diagnosis not present

## 2013-04-04 LAB — CBC WITH DIFFERENTIAL/PLATELET
HCT: 40.6 % (ref 39.0–52.0)
Hemoglobin: 14.1 g/dL (ref 13.0–17.0)
Lymphocytes Relative: 10 % — ABNORMAL LOW (ref 12–46)
Lymphs Abs: 1 10*3/uL (ref 0.7–4.0)
Monocytes Absolute: 0.5 10*3/uL (ref 0.1–1.0)
Monocytes Relative: 5 % (ref 3–12)
Neutro Abs: 8.5 10*3/uL — ABNORMAL HIGH (ref 1.7–7.7)
RBC: 4.57 MIL/uL (ref 4.22–5.81)
WBC: 10 10*3/uL (ref 4.0–10.5)

## 2013-04-04 LAB — COMPREHENSIVE METABOLIC PANEL
Alkaline Phosphatase: 135 U/L — ABNORMAL HIGH (ref 39–117)
BUN: 91 mg/dL — ABNORMAL HIGH (ref 6–23)
CO2: 17 mEq/L — ABNORMAL LOW (ref 19–32)
Chloride: 99 mEq/L (ref 96–112)
Creatinine, Ser: 1.79 mg/dL — ABNORMAL HIGH (ref 0.50–1.35)
GFR calc Af Amer: 43 mL/min — ABNORMAL LOW (ref >90)
GFR calc non Af Amer: 37 mL/min — ABNORMAL LOW (ref >90)
Total Bilirubin: 0.6 mg/dL (ref 0.3–1.2)

## 2013-04-04 LAB — URINE MICROSCOPIC-ADD ON

## 2013-04-04 LAB — URINALYSIS, ROUTINE W REFLEX MICROSCOPIC
Bilirubin Urine: NEGATIVE
Glucose, UA: NEGATIVE mg/dL
Nitrite: NEGATIVE
Specific Gravity, Urine: 1.021 (ref 1.005–1.030)
pH: 5.5 (ref 5.0–8.0)

## 2013-04-04 MED ORDER — DIPHENOXYLATE-ATROPINE 2.5-0.025 MG PO TABS
2.0000 | ORAL_TABLET | Freq: Two times a day (BID) | ORAL | Status: DC
  Administered 2013-04-05 – 2013-04-07 (×5): 2 via ORAL
  Filled 2013-04-04 (×6): qty 2

## 2013-04-04 MED ORDER — QUETIAPINE FUMARATE 25 MG PO TABS
25.0000 mg | ORAL_TABLET | Freq: Two times a day (BID) | ORAL | Status: DC
  Administered 2013-04-05 – 2013-04-07 (×5): 25 mg via ORAL
  Filled 2013-04-04 (×7): qty 1

## 2013-04-04 MED ORDER — LOPERAMIDE HCL 2 MG PO CAPS
2.0000 mg | ORAL_CAPSULE | Freq: Two times a day (BID) | ORAL | Status: DC
  Administered 2013-04-05 – 2013-04-07 (×5): 2 mg via ORAL
  Filled 2013-04-04 (×7): qty 1

## 2013-04-04 MED ORDER — FERROUS SULFATE 325 (65 FE) MG PO TABS
325.0000 mg | ORAL_TABLET | Freq: Two times a day (BID) | ORAL | Status: DC
Start: 2013-04-05 — End: 2013-04-07
  Administered 2013-04-05 – 2013-04-07 (×5): 325 mg via ORAL
  Filled 2013-04-04 (×7): qty 1

## 2013-04-04 MED ORDER — HEPARIN SODIUM (PORCINE) 5000 UNIT/ML IJ SOLN
5000.0000 [IU] | Freq: Three times a day (TID) | INTRAMUSCULAR | Status: DC
  Administered 2013-04-05 – 2013-04-07 (×8): 5000 [IU] via SUBCUTANEOUS
  Filled 2013-04-04 (×11): qty 1

## 2013-04-04 MED ORDER — CARBOXYMETHYLCELLUL-GLYCERIN 0.5-0.9 % OP SOLN: 1.0000 [drp] | Freq: Two times a day (BID) | OPHTHALMIC | Status: DC

## 2013-04-04 MED ORDER — DEXTROSE-NACL 5-0.9 % IV SOLN
INTRAVENOUS | Status: DC
  Administered 2013-04-04 – 2013-04-06 (×3): via INTRAVENOUS

## 2013-04-04 MED ORDER — TOBRAMYCIN-DEXAMETHASONE 0.3-0.1 % OP OINT
1.0000 "application " | TOPICAL_OINTMENT | Freq: Two times a day (BID) | OPHTHALMIC | Status: DC
  Administered 2013-04-05 – 2013-04-07 (×5): 1 via OPHTHALMIC
  Filled 2013-04-04 (×3): qty 3.5

## 2013-04-04 MED ORDER — SACCHAROMYCES BOULARDII 250 MG PO CAPS
250.0000 mg | ORAL_CAPSULE | Freq: Every day | ORAL | Status: DC
  Administered 2013-04-05 – 2013-04-07 (×3): 250 mg via ORAL
  Filled 2013-04-04 (×3): qty 1

## 2013-04-04 MED ORDER — CHOLESTYRAMINE 4 G PO PACK
1.0000 | PACK | Freq: Every morning | ORAL | Status: DC
  Administered 2013-04-05 – 2013-04-07 (×2): 1 via ORAL
  Filled 2013-04-04 (×3): qty 1

## 2013-04-04 MED ORDER — AMANTADINE HCL 100 MG PO CAPS
100.0000 mg | ORAL_CAPSULE | Freq: Every day | ORAL | Status: DC
Start: 2013-04-05 — End: 2013-04-07
  Administered 2013-04-05 – 2013-04-07 (×3): 100 mg via ORAL
  Filled 2013-04-04 (×3): qty 1

## 2013-04-04 MED ORDER — POLYVINYL ALCOHOL 1.4 % OP SOLN
1.0000 [drp] | Freq: Two times a day (BID) | OPHTHALMIC | Status: DC
  Administered 2013-04-05 – 2013-04-07 (×5): 1 [drp] via OPHTHALMIC
  Filled 2013-04-04: qty 15

## 2013-04-04 MED ORDER — SODIUM CHLORIDE 0.9 % IV BOLUS (SEPSIS)
1000.0000 mL | Freq: Once | INTRAVENOUS | Status: AC
  Administered 2013-04-04: 1000 mL via INTRAVENOUS

## 2013-04-04 MED ORDER — HYDROCODONE-ACETAMINOPHEN 5-325 MG PO TABS
1.0000 | ORAL_TABLET | Freq: Four times a day (QID) | ORAL | Status: DC | PRN
  Administered 2013-04-05: 1 via ORAL
  Filled 2013-04-04: qty 1

## 2013-04-04 MED ORDER — ONDANSETRON HCL 4 MG/2ML IJ SOLN: 4.0000 mg | Freq: Four times a day (QID) | INTRAMUSCULAR | Status: DC | PRN

## 2013-04-04 MED ORDER — HYPROMELLOSE (GONIOSCOPIC) 2.5 % OP SOLN: 1.0000 [drp] | Freq: Two times a day (BID) | OPHTHALMIC | Status: DC

## 2013-04-04 MED ORDER — VITAMIN D (ERGOCALCIFEROL) 1.25 MG (50000 UNIT) PO CAPS: 50000.0000 [IU] | ORAL_CAPSULE | ORAL | Status: DC

## 2013-04-04 MED ORDER — CARBIDOPA-LEVODOPA ER 50-200 MG PO TBCR
1.0000 | EXTENDED_RELEASE_TABLET | Freq: Four times a day (QID) | ORAL | Status: DC
  Administered 2013-04-05 – 2013-04-07 (×9): 1 via ORAL
  Filled 2013-04-04 (×13): qty 1

## 2013-04-04 MED ORDER — ONDANSETRON HCL 4 MG PO TABS: 4.0000 mg | ORAL_TABLET | Freq: Four times a day (QID) | ORAL | Status: DC | PRN

## 2013-04-04 NOTE — ED Notes (Signed)
MD at bedside. 

## 2013-04-04 NOTE — Progress Notes (Signed)
Patient ID: Steve Sloan, male   DOB: 19-Jul-1945, 67 y.o.   MRN: [id number] Facility;  [facility] Chief complaint; "doesn't look that good" History; this is a 67 year old 67 year old man with advanced Parkinson's disease. Staff report that he is not been eating and drinking well lately. There is appear to be some amount of weight loss over the last month perhaps as much is 15 pounds.   has a past medical history of Parkinson's disease; Hypotension; Brain tumor; Encounter for intubation; Dysphagia; Enterocutaneous fistula; Chronic diarrhea; Anemia; Anal fistula; Cancer; Dementia; Nervous system device, implant, or graft complication (03/08/2013); and Post-operative state (03/08/2013).  Past Surgical History  Procedure Laterality Date  . Pacemaker insertion    . Deep brain stimulator placement      with removal of brain cells  . Colectomy    . Subthalamic stimulator battery replacement N/A 12/22/2012    Procedure: Deep brain stimulator battery change;  Surgeon: Maeola Harman, MD;  Location: [facility];  Service: Neurosurgery;  Laterality: N/A;   Review of systems; not really possible from the page as it is difficult to understand his speech. Staff report that he is less communicative, with poor oral and  Physical exam Gen.; I agree they certainly looks less interactive and more lethargic than usual. Vital temperature is 97.1 pulse 64 respirations 18 blood pressure 116/76 weight is 148 pound Respiratory shallower [place] however no crackles or wheezing in his work of breathing appears normal Cardiac heart sounds are normal he appears to be volume contracted Abdomen; evidence of weight loss but no liver spleen or tenderness GU there is suprapubic and left costovertebral angle tenderness  Impression/plan #1 I agree with the nurse practitioner's assessment of dehydration, ideally I would like to given some isotonic fluid however they only have half-normal saline at present time therefore  we'll start this while we await lab work to return #2 probable pyelonephritis; a culture will be done we'll start him on empiric Rocephin #3 celiac disease; one this could be playing in this setting I am really uncertain however I would certainly agree that it is possible and followup lab work has been ordered. #4 chronic diarrhea which is at least in part due to a short bowel syndrome status post colectomy and also the suggestion of celiac disease. Improved when he was placed on a gluten-free diet #5 protein calorie malnutrition

## 2013-04-04 NOTE — ED Notes (Signed)
Bed: WA07 Expected date:  Expected time:  Means of arrival:  Comments: malaise

## 2013-04-04 NOTE — ED Notes (Signed)
Patient transported to CT 

## 2013-04-04 NOTE — ED Notes (Signed)
Per [facility]- Pt resides at [facility]. Full Code No yellow copy present. Per staff pt has not been his usual self. Seen by NP today.  Ordered IV fluid was uale to establish IV and EMS called. Pt has chronic diarrhea

## 2013-04-04 NOTE — H&P (Signed)
[facility] History and Physical  JAIRO BELLEW ZOX:[id number] DOB: [birth year]    PCP:   Terald Sleeper, MD   Chief Complaint: lethargy.  HPI: Steve Sloan is an 67 y.o. male with hx of Parkinson's disease, some dementia, ciliac sprue with frequent diarrhea, dysphagia (but on regular diet), anemia, s/p brain stimulator placement, s/p PPM, presents to the ER as he was not eating and drinking as much along with having been more lethargy.  He was seen by Dr Leanord Hawking at the nursing facility, was noted to have UTI, along with dehydration, and having diarrhea.  Evaluation today in the ER included a BUN of 91, Cr of 1.79, WBC of 10K.  His UA was cloudy but showed only 3 WBCs ( he was given Rocephin at the [facility]). His CXR was clear.  He was thought to be uremic from volume depletion, and hospitalist was asked to admit him for further evalatuion and treatment.  Rewiew of Systems:  ROS is limited, but he has no shortness of breath, HA, chest pain, or abdominal pain.   Past Medical History  Diagnosis Date  . Parkinson's disease   . Hypotension   . Brain tumor   . Encounter for intubation   . Dysphagia   . Enterocutaneous fistula   . Chronic diarrhea   . Anemia   . Anal fistula   . Cancer   . Dementia   . Nervous system device, implant, or graft complication 03/08/2013  . Post-operative state 03/08/2013    Past Surgical History  Procedure Laterality Date  . Pacemaker insertion    . Deep brain stimulator placement      with removal of brain cells  . Colectomy    . Subthalamic stimulator battery replacement N/A 12/22/2012    Procedure: Deep brain stimulator battery change;  Surgeon: Maeola Harman, MD;  Location: [facility];  Service: Neurosurgery;  Laterality: N/A;    Medications:  HOME MEDS: Prior to Admission medications   Medication Sig Start Date End Date Taking? Authorizing Provider  amantadine (SYMMETREL) 100 MG capsule Take 100 mg by mouth daily.   Yes Historical  Provider, MD  carbidopa-levodopa (SINEMET CR) 50-200 MG per tablet Take 1 tablet by mouth 4 (four) times daily.   Yes Historical Provider, MD  Carboxymethylcellul-Glycerin (OPTIVE SENSITIVE) 0.5-0.9 % SOLN Place 1 drop into both eyes 2 (two) times daily.   Yes Historical Provider, MD  cholestyramine Lanetta Inch) 4 G packet Take 1 packet by mouth every morning.    Yes Historical Provider, MD  diphenoxylate-atropine (LOMOTIL) 2.5-0.025 MG per tablet Take 2 tablets by mouth 2 (two) times daily.   Yes Historical Provider, MD  ferrous sulfate 325 (65 FE) MG tablet Take 325 mg by mouth 2 (two) times daily with a meal.    Yes Historical Provider, MD  HYDROcodone-acetaminophen (NORCO/VICODIN) 5-325 MG per tablet Take 1 tablet by mouth every 6 (six) hours as needed for moderate pain.   Yes Historical Provider, MD  hydroxypropyl methylcellulose (ISOPTO TEARS) 2.5 % ophthalmic solution Place 1 drop into both eyes 2 (two) times daily.   Yes Historical Provider, MD  loperamide (IMODIUM) 2 MG capsule Take 2 mg by mouth 2 (two) times daily.   Yes Historical Provider, MD  QUEtiapine (SEROQUEL) 25 MG tablet Take 25 mg by mouth 2 (two) times daily.    Yes Historical Provider, MD  saccharomyces boulardii (FLORASTOR) 250 MG capsule Take 250 mg by mouth daily.   Yes Historical Provider, MD  tobramycin-dexamethasone Wallene Dales)  ophthalmic ointment Place 1 application into both eyes 2 (two) times daily.   Yes Historical Provider, MD  Vitamin D, Ergocalciferol, (DRISDOL) 50000 UNITS CAPS capsule Take 50,000 Units by mouth every 30 (thirty) days. On the 25th   Yes Historical Provider, MD     Allergies:  Allergies  Allergen Reactions  . Donepezil     Unknown     Social History:   reports that he has never smoked. He has never used smokeless tobacco. He reports that he does not drink alcohol or use illicit drugs.  Family History: History reviewed. No pertinent family history.   Physical Exam: Filed Vitals:    04/04/13 1628 04/04/13 1829 04/04/13 2100 04/04/13 2204  BP: 108/61  127/70 134/78  Pulse: 69   68  Temp:  99.1 F (37.3 C)  98.8 F (37.1 C)  TempSrc:  Rectal  Oral  Resp: 12  16 16   SpO2: 100%   100%   Blood pressure 134/78, pulse 68, temperature 98.8 F (37.1 C), temperature source Oral, resp. rate 16, SpO2 100.00%.  GEN:  Pleasant patient lying in the stretcher in no acute distress; cooperative with exam. PSYCH:  Answer questions but is confused; does not appear anxious or depressed; affect is appropriate. HEENT: Mucous membranes pink and anicteric; PERRLA; EOM intact; no cervical lymphadenopathy nor thyromegaly or carotid bruit; no JVD; There were no stridor. Neck is very supple. Breasts:: Not examined CHEST WALL: No tenderness, PPM. CHEST: Normal respiration, clear to auscultation bilaterally.  HEART: Regular rate and rhythm.  There are no murmur, rub, or gallops.   BACK: No kyphosis or scoliosis; no CVA tenderness ABDOMEN: soft and non-tender; no masses, no organomegaly, normal abdominal bowel sounds; no pannus; no intertriginous candida. There is no rebound and no distention. Rectal Exam: Not done EXTREMITIES: No bone or joint deformity; age-appropriate arthropathy of the hands and knees; no edema; no ulcerations.  There is no calf tenderness. Genitalia: not examined PULSES: 2+ and symmetric SKIN: Normal hydration no rash or ulceration CNS: Cranial nerves 2-12 grossly intact no focal lateralizing neurologic deficit.  Speech is fluent; uvula elevated with phonation, facial symmetry and tongue midline. He is able to move all his extremitites.  Labs on Admission:  Basic Metabolic Panel:  Recent Labs Lab 04/04/13 1645  NA 131*  K 5.1  CL 99  CO2 17*  GLUCOSE 112*  BUN 91*  CREATININE 1.79*  CALCIUM 10.2   Liver Function Tests:  Recent Labs Lab 04/04/13 1645  AST 25  ALT 11  ALKPHOS 135*  BILITOT 0.6  PROT 9.0*  ALBUMIN 4.8   No results found for this  basename: LIPASE, AMYLASE,  in the last 168 hours No results found for this basename: AMMONIA,  in the last 168 hours CBC:  Recent Labs Lab 04/04/13 1645  WBC 10.0  NEUTROABS 8.5*  HGB 14.1  HCT 40.6  MCV 88.8  PLT 202   Cardiac Enzymes: No results found for this basename: CKTOTAL, CKMB, CKMBINDEX, TROPONINI,  in the last 168 hours  CBG: No results found for this basename: GLUCAP,  in the last 168 hours   Radiological Exams on Admission: Dg Chest 1 View  04/04/2013   CLINICAL DATA:  Weakness, hypotension  EXAM: CHEST - 1 VIEW  COMPARISON:  12/14/2012  FINDINGS: Cardiomediastinal silhouette is stable. No acute infiltrate or pleural effusion. No pulmonary edema. Old left rib fracture again noted.  IMPRESSION: No active disease.   Electronically Signed   By: Natasha Mead  M.D.   On: 04/04/2013 17:35   Ct Head Wo Contrast  04/04/2013   CLINICAL DATA:  Recent battery replacement for a brain stimulator. Lethargy.  EXAM: CT HEAD WITHOUT CONTRAST  TECHNIQUE: Contiguous axial images were obtained from the base of the skull through the vertex without intravenous contrast.  COMPARISON:  Head CT scan 06/16/2012.  FINDINGS: Brain stimulator device is again seen with its tip in the right thalamus/midbrain, unchanged. No evidence of acute abnormality including infarction, hemorrhage, mass lesion, mass effect, midline shift or abnormal extra-axial fluid collection is identified. No hydrocephalus or pneumocephalus. Mild atrophy and chronic microvascular ischemic change are again seen. Bifrontal burr holes are identified.  IMPRESSION: No acute abnormality.  Stable compared to prior exam.   Electronically Signed   By: Drusilla Kanner M.D.   On: 04/04/2013 17:38   Assessment/Plan Present on Admission:  . AKI (acute kidney injury) . Celiac disease . PD (Parkinson's disease) . Parkinson's disease (tremor, stiffness, slow motion, unstable posture)  PLAN:  I suspect he is severely volume depleted and is  having AKI from pre renal azotemia.  He could be having uremia as high as his BUN is at this time.  He had a UTI and was given Rocephin, so I will continue it for now.  For his PD, will continue his meds. He has chronic diarrhea, partly from short bowel, and partly from ciliac disease as per his PCP's note.  Will give IVF and follow Cr.  If it doesn't resolve, will further investigate. He is reportedly full code by the EDP.  Will honor his wishes.  He will be admitted to [facility] service under medical floor.  Thank you for allowing me to participate in his care.  Other plans as per orders.  Code Status: FULL Unk Lightning, MD. [facility] Pager [phone] 7pm to 7am.  04/04/2013, 10:25 PM

## 2013-04-04 NOTE — ED Provider Notes (Signed)
CSN: [id number]     Arrival date & time 04/04/13  1608 History   First MD Initiated Contact with Patient 04/04/13 1628     Chief Complaint  Patient presents with  . 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 Patient is very difficult to understand at baseline. The cardiac history is taken from interview with nursing home staff and EMS. Per EMS, they were called because the patient "hasn't been his usual self."  The nursing home in PE reveals the patient has decreased mental status.  No known fevers.  Level V caveat for altered mental status.  Past Medical History  Diagnosis Date  . Parkinson's disease   . Hypotension   . Brain tumor   . Encounter for intubation   . Dysphagia   . Enterocutaneous fistula   . Chronic diarrhea   . Anemia   . Anal fistula   . Cancer   . Dementia   . Nervous system device, implant, or graft complication 03/08/2013  . Post-operative state 03/08/2013   Past Surgical History  Procedure Laterality Date  . Pacemaker insertion    . Deep brain stimulator placement      with removal of brain cells  . Colectomy    . Subthalamic stimulator battery replacement N/A 12/22/2012    Procedure: Deep brain stimulator battery change;  Surgeon: Maeola Harman, MD;  Location: [facility];  Service: Neurosurgery;  Laterality: N/A;   History reviewed. No pertinent family history. History  Substance Use Topics  . Smoking status: Never Smoker   . Smokeless tobacco: Never Used  . Alcohol Use: No    Review of Systems  Unable to perform ROS: Mental status change    Allergies  Donepezil  Home Medications   No current outpatient prescriptions on file. BP 150/72  Pulse 60  Temp(Src) 97.9 F  (36.6 C) (Oral)  Resp 16  Ht 6\' 3"  (1.905 m)  Wt 147 lb 4.3 oz (66.8 kg)  BMI 18.41 kg/m2  SpO2 100% Physical Exam  Nursing note and vitals reviewed. Constitutional: No distress.  Cachectic, chronically ill-appearing  HENT:  Head: Normocephalic and atraumatic.   Mucous membranes dry, dark discoloration to the tongue  Eyes: Pupils are equal, round, and reactive to light.  Neck: Neck supple.  Cardiovascular: Normal rate, regular rhythm and normal heart sounds.   No murmur heard. Pulmonary/Chest: Effort normal and breath sounds normal. No respiratory distress. He has no wheezes.  Abdominal: Soft. Bowel sounds are normal. There is no tenderness. There is no rebound.  Musculoskeletal: He exhibits no edema.  Lymphadenopathy:    He has no cervical adenopathy.  Neurological: He is alert.  Oriented to self, moves all 4 extremities, follows commands, no focal deficit noted  Skin: Skin is warm and dry.  Psychiatric: He has a normal mood and affect.    ED Course  Procedures (including critical care time) Labs Review Labs Reviewed  CBC WITH DIFFERENTIAL - Abnormal; Notable for the following:    Neutrophils Relative % 84 (*)    Neutro Abs 8.5 (*)    Lymphocytes Relative 10 (*)    All other components within normal limits  COMPREHENSIVE METABOLIC PANEL - Abnormal; Notable for the following:    Sodium 131 (*)    CO2 17 (*)    Glucose,  Bld 112 (*)    BUN 91 (*)    Creatinine, Ser 1.79 (*)    Total Protein 9.0 (*)    Alkaline Phosphatase 135 (*)    GFR calc non Af Amer 37 (*)    GFR calc Af Amer 43 (*)    All other components within normal limits  URINALYSIS, ROUTINE W REFLEX MICROSCOPIC - Abnormal; Notable for the following:    APPearance CLOUDY (*)    Hgb urine dipstick SMALL (*)    All other components within normal limits  URINE MICROSCOPIC-ADD ON - Abnormal; Notable for the following:    Casts HYALINE CASTS (*)    All other components within normal limits  SODIUM, URINE,  RANDOM  CREATININE, URINE, RANDOM  CBC  BASIC METABOLIC PANEL  CG4 I-STAT (LACTIC ACID)   Imaging Review Dg Chest 1 View  04/04/2013   CLINICAL DATA:  Weakness, hypotension  EXAM: CHEST - 1 VIEW  COMPARISON:  12/14/2012  FINDINGS: Cardiomediastinal silhouette is stable. No acute infiltrate or pleural effusion. No pulmonary edema. Old left rib fracture again noted.  IMPRESSION: No active disease.   Electronically Signed   By: Natasha Mead M.D.   On: 04/04/2013 17:35   Ct Head Wo Contrast  04/04/2013   CLINICAL DATA:  Recent battery replacement for a brain stimulator. Lethargy.  EXAM: CT HEAD WITHOUT CONTRAST  TECHNIQUE: Contiguous axial images were obtained from the base of the skull through the vertex without intravenous contrast.  COMPARISON:  Head CT scan 06/16/2012.  FINDINGS: Brain stimulator device is again seen with its tip in the right thalamus/midbrain, unchanged. No evidence of acute abnormality including infarction, hemorrhage, mass lesion, mass effect, midline shift or abnormal extra-axial fluid collection is identified. No hydrocephalus or pneumocephalus. Mild atrophy and chronic microvascular ischemic change are again seen. Bifrontal burr holes are identified.  IMPRESSION: No acute abnormality.  Stable compared to prior exam.   Electronically Signed   By: Drusilla Kanner M.D.   On: 04/04/2013 17:38    EKG Interpretation    Date/Time:    Ventricular Rate:    PR Interval:    QRS Duration:   QT Interval:    QTC Calculation:   R Axis:     Text Interpretation:             EKG independently reviewed by myself: Normal sinus rhythm, left bundle branch MDM   1. Uremia   2. Acute kidney failure   3. Dehydration   4. AKI (acute kidney injury)    Patient presents with generalized weakness and lethargy. He is nonfocal on exam. Lab work was initiated. Lab work is notable for a BUN of 91 an acute kidney injury with creatinine of 1.8. I suspect this may be the cause of the  patient's altered mental status. Urine studies were sent. Other lab work is within normal limits.  Patient will be admitted to the hospitalist.    Shon Baton, MD 04/05/13 0000

## 2013-04-04 NOTE — ED Notes (Signed)
Patient transported to X-ray 

## 2013-04-04 NOTE — ED Notes (Signed)
Attempted In/Out on pt. Could not get anything at this moment. RN Morrie Sheldon was present

## 2013-04-05 DIAGNOSIS — E43 Unspecified severe protein-calorie malnutrition: Secondary | ICD-10-CM | POA: Insufficient documentation

## 2013-04-05 LAB — MRSA PCR SCREENING: MRSA by PCR: NEGATIVE

## 2013-04-05 LAB — BASIC METABOLIC PANEL
CO2: 19 mEq/L (ref 19–32)
Glucose, Bld: 95 mg/dL (ref 70–99)
Potassium: 4.4 mEq/L (ref 3.5–5.1)
Sodium: 134 mEq/L — ABNORMAL LOW (ref 135–145)

## 2013-04-05 LAB — CBC
Hemoglobin: 13.8 g/dL (ref 13.0–17.0)
Platelets: UNDETERMINED 10*3/uL (ref 150–400)
RBC: 4.33 MIL/uL (ref 4.22–5.81)
WBC: 7.5 10*3/uL (ref 4.0–10.5)

## 2013-04-05 LAB — SODIUM, URINE, RANDOM: Sodium, Ur: <15 mEq/L

## 2013-04-05 LAB — CREATININE, URINE, RANDOM: Creatinine, Urine: 204.3 mg/dL

## 2013-04-05 MED ORDER — ENSURE COMPLETE PO LIQD
237.0000 mL | Freq: Two times a day (BID) | ORAL | Status: DC
  Administered 2013-04-05 – 2013-04-07 (×3): 237 mL via ORAL

## 2013-04-05 NOTE — Progress Notes (Addendum)
INITIAL NUTRITION ASSESSMENT  Pt meets criteria for severe MALNUTRITION in the context of chronic illness as evidenced by <75% estimated energy intake with 14.5% weight loss in the past month.  DOCUMENTATION CODES Per approved criteria  -Severe malnutrition in the context of chronic illness -Underweight   INTERVENTION: - Ensure Complete BID - Assisted pt with ordering lunch - Will continue to monitor    NUTRITION DIAGNOSIS: Inadequate oral intake related to poor appetite as evidenced by 10% meal intake.    Goal: Pt to consume >90% of meals/supplements  Monitor:  Weights, labs, intake   Reason for Assessment: Malnutrition screening tool   67 y.o. male  Admitting Dx: AKI (acute kidney injury)  ASSESSMENT: Pt is an 67 y.o. male with hx of Parkinson's disease, some dementia, celiac sprue with frequent diarrhea, dysphagia (but on regular diet), anemia, s/p brain stimulator placement, s/p PPM, presents to the ER as he was not eating and drinking as much along with having been more lethargy. He was seen by Dr. Leanord Hawking at the nursing facility, was noted to have UTI, along with dehydration, and having diarrhea.   Met with pt, who spoke very softly and quietly during conversation, at times being hard to understand. States he has lost 60 pounds but did not say over what time frame. Denies any problems chewing or swallowing. Reports poor appetite for the past few weeks. States he ate 100% of his breakfast this morning. Per weight trend, pt's weight down 25 pounds in the past month. Reports following gluten free diet PTA, discussed with dietary staff yesterday when they were assisting pt with ordering his lunch and changed diet to gluten free.   Height: Ht Readings from Last 1 Encounters:  04/04/13 6\' 3"  (1.905 m)    Weight: Wt Readings from Last 1 Encounters:  04/04/13 147 lb 4.3 oz (66.8 kg)    Ideal Body Weight: 196 lb  % Ideal Body Weight: 75%  Wt Readings from Last 10  Encounters:  04/04/13 147 lb 4.3 oz (66.8 kg)  03/08/13 172 lb (78.019 kg)  12/21/12 168 lb (76.204 kg)  12/21/12 168 lb (76.204 kg)  08/30/12 172 lb (78.019 kg)  06/16/12 173 lb (78.472 kg)  05/04/12 172 lb 6.4 oz (78.2 kg)    Usual Body Weight: 207 lb per pt  % Usual Body Weight: 71%  BMI:  Body mass index is 18.41 kg/(m^2). Underweight  Estimated Nutritional Needs: Kcal: 2000-2200 Protein: 100-115g Fluid: 2-2.2L/day  Skin: intact  Diet Order: Dysphagia 3, thin  EDUCATION NEEDS: -No education needs identified at this time   Intake/Output Summary (Last 24 hours) at 04/05/13 1345 Last data filed at 04/05/13 0900  Gross per 24 hour  Intake    360 ml  Output      0 ml  Net    360 ml    Last BM: PTA  Labs:   Recent Labs Lab 04/04/13 1645 04/05/13 0800  NA 131* 134*  K 5.1 4.4  CL 99 102  CO2 17* 19  BUN 91* 73*  CREATININE 1.79* 1.28  CALCIUM 10.2 9.3  GLUCOSE 112* 95    CBG (last 3)  No results found for this basename: GLUCAP,  in the last 72 hours  Scheduled Meds: . amantadine  100 mg Oral Daily  . carbidopa-levodopa  1 tablet Oral QID  . cholestyramine  1 packet Oral q morning - 10a  . diphenoxylate-atropine  2 tablet Oral BID  . ferrous sulfate  325 mg Oral BID  WC  . heparin  5,000 Units Subcutaneous Q8H  . hydroxypropyl methylcellulose  1 drop Both Eyes BID  . loperamide  2 mg Oral BID  . polyvinyl alcohol  1 drop Both Eyes BID  . QUEtiapine  25 mg Oral BID  . saccharomyces boulardii  250 mg Oral Daily  . tobramycin-dexamethasone  1 application Both Eyes BID  . [START ON 04/20/2013] Vitamin D (Ergocalciferol)  50,000 Units Oral Q30 days    Continuous Infusions: . dextrose 5 % and 0.9% NaCl 75 mL/hr at 04/05/13 1219    Past Medical History  Diagnosis Date  . Parkinson's disease   . Hypotension   . Brain tumor   . Encounter for intubation   . Dysphagia   . Enterocutaneous fistula   . Chronic diarrhea   . Anemia   . Anal fistula    . Cancer   . Dementia   . Nervous system device, implant, or graft complication 03/08/2013  . Post-operative state 03/08/2013    Past Surgical History  Procedure Laterality Date  . Pacemaker insertion    . Deep brain stimulator placement      with removal of brain cells  . Colectomy    . Subthalamic stimulator battery replacement N/A 12/22/2012    Procedure: Deep brain stimulator battery change;  Surgeon: Maeola Harman, MD;  Location: [facility];  Service: Neurosurgery;  Laterality: N/A;    Levon Hedger MS, RD, LDN [phone] Pager [phone] After Hours Pager

## 2013-04-05 NOTE — Care Management Note (Signed)
   CARE MANAGEMENT NOTE 04/05/2013  Patient:  ARLISS, FRISINA   Account Number:  [id number]  Date Initiated:  04/05/2013  Documentation initiated by:  Arlita Buffkin  Subjective/Objective Assessment:   67 yo male admitted with acute kidney injury.PCP: Terald Sleeper, MD     Action/Plan:   SNF   Anticipated DC Date:     Anticipated DC Plan:  SKILLED NURSING FACILITY      DC Planning Services  CM consult      Choice offered to / List presented to:  NA   DME arranged  NA      DME agency  NA     HH arranged  NA      HH agency  NA   Status of service:  In process, will continue to follow Medicare Important Message given?   (If response is "NO", the following Medicare IM given date fields will be blank) Date Medicare IM given:   Date Additional Medicare IM given:    Discharge Disposition:    Per UR Regulation:  Reviewed for med. necessity/level of care/duration of stay  If discussed at Long Length of Stay Meetings, dates discussed:    Comments:  04/05/13 1358 Ezelle Surprenant,MSN,RN [phone] Pt from SNF. CSW following. CM to sign off.

## 2013-04-05 NOTE — Progress Notes (Signed)
Clinical Social Work Department BRIEF PSYCHOSOCIAL ASSESSMENT 04/05/2013  Patient:  Steve Sloan, Steve Sloan     Account Number:  [id number]     Admit date:  04/04/2013  Clinical Social Worker:  Jacelyn Grip  Date/Time:  04/05/2013 10:00 AM  Referred by:  Physician  Date Referred:  04/05/2013 Referred for  SNF Placement   Other Referral:   Interview type:  Other - See comment Other interview type:   pt HCPOA    PSYCHOSOCIAL DATA Living Status:  FACILITY Admitted from facility:  [facility] Level of care:  Skilled Nursing Facility Primary support name:  Maurine Minister Nance/HCPOA/[id number] Primary support relationship to patient:  FRIEND Degree of support available:   adequate    CURRENT CONCERNS Current Concerns  Post-Acute Placement   Other Concerns:    SOCIAL WORK ASSESSMENT / PLAN CSW received referral that pt admitted from [facility].    CSW reviewed chart and noted that pt is nonverbal and unable to participate in assessment. CSW contacted first listed phone number on pt emergency contact list and was connected to [facility]. CSW spoke with admissions coordinator who informed this CSW that pt HCPOA and responsible party is Maurine Minister Nance(home phone number: [phone] cell phone number: [phone])    CSW contacted Payton Emerald via telephone. Pt HCPOA confirmed that pt is resident at [facility] and plan is for pt to return upon discharge.    Pt HCPOA discussed that he will be to hospital to visit pt today.    CSW completed FL2 and confirmed with Lacinda Axon that pt can return when medically stable.    CSW to continue to follow and facilitate pt discharge needs when pt medically stable for discharge.   Assessment/plan status:  Psychosocial Support/Ongoing Assessment of Needs Other assessment/ plan:   discharge planning   Information/referral to community resources:   Referral back to [place]    PATIENT'S/FAMILY'S RESPONSE TO  PLAN OF CARE: Pt alert, but orientation is difficult to assess as pt is nonverbal. Pt friend and HCPOA appears supportive and actively involved in pt care. Pt HCPOA reports that pt has been at [facility] for two years.     Jacklynn Lewis, MSW, LCSWA  Clinical Social Work [phone]

## 2013-04-05 NOTE — Progress Notes (Signed)
Patient more alert and cooperative.Incontinent of large amount of urine,sinemet schedule adjusted as per MD.Scabs present on nose and face and right temple.

## 2013-04-05 NOTE — Progress Notes (Signed)
Steve Sloan EAV:[id number] DOB: 12-22-1945 DOA: 04/04/2013 PCP: Terald Sleeper, MD  Brief narrative: 67 year old male known Parkinson's, status post brain stimulator, dementia, sprue, prior colectomy causing diarrhea-admitted 04/04/13 with lethargy, dehydration and uremia  Past medical history-As per Problem list Chart reviewed as below- Reviewed  Consultants:  None  Procedures:  None  Antibiotics:  None   Subjective  Doing better.  Tolerating diet Somewhat confused His carbidopa is currently being given slightly off schedule and nursing is aware of the need to adjust the schedule    Objective    Interim History: None  Telemetry: None telemetry  Objective: Filed Vitals:   04/04/13 2204 04/04/13 2342 04/05/13 0708 04/05/13 1400  BP: 134/78 150/72 120/65 126/74  Pulse: 68 60 64 66  Temp: 98.8 F (37.1 C) 97.9 F (36.6 C) 98.2 F (36.8 C) 98.5 F (36.9 C)  TempSrc: Oral Oral Oral Oral  Resp: 16 16 16 16   Height:  6\' 3"  (1.905 m)    Weight:  66.8 kg (147 lb 4.3 oz)    SpO2: 100% 100% 100% 100%    Intake/Output Summary (Last 24 hours) at 04/05/13 1532 Last data filed at 04/05/13 0900  Gross per 24 hour  Intake    360 ml  Output      0 ml  Net    360 ml    Exam:  General: Wasted cachectic Caucasian male, Body mass index is 18.41 kg/(m^2). Cardiovascular: S1-S2 no murmur rub or gallop Respiratory: Clinically clear Abdomen: Scaphoid soft nontender Skin multiple areas on face of melanoma this change-left eyebrow seems to have undergone Mohs microsurgery Neuro mild tremor  Data Reviewed: Basic Metabolic Panel:  Recent Labs Lab 04/04/13 1645 04/05/13 0800  NA 131* 134*  K 5.1 4.4  CL 99 102  CO2 17* 19  GLUCOSE 112* 95  BUN 91* 73*  CREATININE 1.79* 1.28  CALCIUM 10.2 9.3   Liver Function Tests:  Recent Labs Lab 04/04/13 1645  AST 25  ALT 11  ALKPHOS 135*  BILITOT 0.6  PROT 9.0*  ALBUMIN 4.8   No results found for this  basename: LIPASE, AMYLASE,  in the last 168 hours No results found for this basename: AMMONIA,  in the last 168 hours CBC:  Recent Labs Lab 04/04/13 1645 04/05/13 0800  WBC 10.0 7.5  NEUTROABS 8.5*  --   HGB 14.1 13.8  HCT 40.6 38.3*  MCV 88.8 88.5  PLT 202 PLATELET CLUMPS NOTED ON SMEAR, UNABLE TO ESTIMATE   Cardiac Enzymes: No results found for this basename: CKTOTAL, CKMB, CKMBINDEX, TROPONINI,  in the last 168 hours BNP: No components found with this basename: POCBNP,  CBG: No results found for this basename: GLUCAP,  in the last 168 hours  No results found for this or any previous visit (from the past 240 hour(s)).   Studies:              All Imaging reviewed and is as per above notation   Scheduled Meds: . amantadine  100 mg Oral Daily  . carbidopa-levodopa  1 tablet Oral QID  . cholestyramine  1 packet Oral q morning - 10a  . diphenoxylate-atropine  2 tablet Oral BID  . feeding supplement (ENSURE COMPLETE)  237 mL Oral BID BM  . ferrous sulfate  325 mg Oral BID WC  . heparin  5,000 Units Subcutaneous Q8H  . hydroxypropyl methylcellulose  1 drop Both Eyes BID  . loperamide  2 mg Oral BID  . polyvinyl  alcohol  1 drop Both Eyes BID  . QUEtiapine  25 mg Oral BID  . saccharomyces boulardii  250 mg Oral Daily  . tobramycin-dexamethasone  1 application Both Eyes BID  . [START ON 04/20/2013] Vitamin D (Ergocalciferol)  50,000 Units Oral Q30 days   Continuous Infusions: . dextrose 5 % and 0.9% NaCl 75 mL/hr at 04/05/13 1219     Assessment/Plan: 1. Acute kidney injury secondary to poor by mouth intake-repleted with saline. Continue overnight and monitor labs in the morning 2. Unlikely pyelonephritis-urinalysis negative, no fevers, hold antibiotics-received one dose of Rocephin at skilled facility 3. 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 Celiac disease--noncompliant with gluten-free  diet 5. Parkinson's disease-nursing instructed to kindly place him on regular schedule of carbidopa 6. Delusional disorder-follow and continue quetiapine 25 twice a day  Code Status: Full Family Communication: [place] [phone], updated 04/05/13 on telephone Disposition Plan: inpatient   Pleas Koch, MD  [facility] Pager [phone] 04/05/2013, 3:32 PM    LOS: 1 day

## 2013-04-06 LAB — BASIC METABOLIC PANEL
CO2: 23 mEq/L (ref 19–32)
Chloride: 110 mEq/L (ref 96–112)
GFR calc non Af Amer: 85 mL/min — ABNORMAL LOW (ref >90)
Glucose, Bld: 105 mg/dL — ABNORMAL HIGH (ref 70–99)
Potassium: 3.8 mEq/L (ref 3.5–5.1)
Sodium: 140 mEq/L (ref 135–145)

## 2013-04-06 MED ORDER — FOSFOMYCIN TROMETHAMINE 3 G PO PACK
3.0000 g | PACK | Freq: Once | ORAL | Status: DC
  Filled 2013-04-06: qty 3

## 2013-04-06 MED ORDER — CIPROFLOXACIN HCL 500 MG PO TABS
500.0000 mg | ORAL_TABLET | Freq: Two times a day (BID) | ORAL | Status: DC
  Administered 2013-04-06 – 2013-04-07 (×2): 500 mg via ORAL
  Filled 2013-04-06 (×4): qty 1

## 2013-04-06 NOTE — Progress Notes (Signed)
ROSHAD HACK ZOX:[id number] DOB: April 12, 1946 DOA: 04/04/2013 PCP: Terald Sleeper, MD  Brief narrative: 67 year old male known Parkinson's, status post brain stimulator, dementia, sprue, prior colectomy causing diarrhea-admitted 04/04/13 with lethargy, dehydration and uremia.    Past medical history-As per Problem list Chart reviewed as below- Reviewed  Consultants:  None  Procedures:  None  Antibiotics:  None   Subjective  Doing better.  Tolerating diet Note vitals show temperature of 95.3, patient less responsive today Patient has a placed back on his regular schedule of carbidopa Currently is on concurrent amantadine which pharmacist tells me can cause CNS depression as well Has not really tolerated diet this morning   Objective    Interim History: None  Telemetry: None telemetry  Objective: Filed Vitals:   04/05/13 0708 04/05/13 1400 04/05/13 2110 04/06/13 0434  BP: 120/65 126/74 97/68 125/62  Pulse: 64 66 58 60  Temp: 98.2 F (36.8 C) 98.5 F (36.9 C) 96.7 F (35.9 C) 96.9 F (36.1 C)  TempSrc: Oral Oral Axillary Axillary  Resp: 16 16 16 16   Height:      Weight:      SpO2: 100% 100% 100% 100%    Intake/Output Summary (Last 24 hours) at 04/06/13 1350 Last data filed at 04/06/13 0900  Gross per 24 hour  Intake   1845 ml  Output      1 ml  Net   1844 ml    Exam:  General: Wasted cachectic Caucasian male, Body mass index is 18.41 kg/(m^2). Cardiovascular: S1-S2 no murmur rub or gallop Respiratory: Clinically clear Abdomen: Scaphoid soft nontender Skin multiple areas on face of melanoma this change-left eyebrow seems to have undergone Mohs microsurgery Neuro mild tremor  Data Reviewed: Basic Metabolic Panel:  Recent Labs Lab 04/04/13 1645 04/05/13 0800 04/06/13 1159  NA 131* 134* 140  K 5.1 4.4 3.8  CL 99 102 110  CO2 17* 19 23  GLUCOSE 112* 95 105*  BUN 91* 73* 39*  CREATININE 1.79* 1.28 0.92  CALCIUM 10.2 9.3 8.6    Liver Function Tests:  Recent Labs Lab 04/04/13 1645  AST 25  ALT 11  ALKPHOS 135*  BILITOT 0.6  PROT 9.0*  ALBUMIN 4.8   No results found for this basename: LIPASE, AMYLASE,  in the last 168 hours No results found for this basename: AMMONIA,  in the last 168 hours CBC:  Recent Labs Lab 04/04/13 1645 04/05/13 0800  WBC 10.0 7.5  NEUTROABS 8.5*  --   HGB 14.1 13.8  HCT 40.6 38.3*  MCV 88.8 88.5  PLT 202 PLATELET CLUMPS NOTED ON SMEAR, UNABLE TO ESTIMATE   Cardiac Enzymes: No results found for this basename: CKTOTAL, CKMB, CKMBINDEX, TROPONINI,  in the last 168 hours BNP: No components found with this basename: POCBNP,  CBG: No results found for this basename: GLUCAP,  in the last 168 hours  Recent Results (from the past 240 hour(s))  MRSA PCR SCREENING     Status: None   Collection Time    04/05/13  6:24 PM      Result Value Range Status   MRSA by PCR NEGATIVE  NEGATIVE Final   Comment:            The GeneXpert MRSA Assay (FDA     approved for NASAL specimens     only), is one component of a     comprehensive MRSA colonization     surveillance program. It is not     intended to diagnose  MRSA     infection nor to guide or     monitor treatment for     MRSA infections.     Studies:              All Imaging reviewed and is as per above notation   Scheduled Meds: . amantadine  100 mg Oral Daily  . carbidopa-levodopa  1 tablet Oral QID  . cholestyramine  1 packet Oral q morning - 10a  . diphenoxylate-atropine  2 tablet Oral BID  . feeding supplement (ENSURE COMPLETE)  237 mL Oral BID BM  . ferrous sulfate  325 mg Oral BID WC  . heparin  5,000 Units Subcutaneous Q8H  . hydroxypropyl methylcellulose  1 drop Both Eyes BID  . loperamide  2 mg Oral BID  . polyvinyl alcohol  1 drop Both Eyes BID  . QUEtiapine  25 mg Oral BID  . saccharomyces boulardii  250 mg Oral Daily  . tobramycin-dexamethasone  1 application Both Eyes BID  . [START ON 04/20/2013]  Vitamin D (Ergocalciferol)  50,000 Units Oral Q30 days   Continuous Infusions: . dextrose 5 % and 0.9% NaCl 75 mL/hr at 04/06/13 0142     Assessment/Plan: 1. Toxic metabolic encephalopathy-unclear if this is related to patient's Percocet was given last night vs. changing the schedule of his anti-Parkinson's drugs. As he is still a little sleepy, we will observe him for another day. He can potentially return to his nursing home tomorrow. 2. Acute kidney injury secondary to poor by mouth intake-repleted with saline. Continue overnight and monitor labs in the morning-creatinine now trending down to 39/0.92, discontinue IV fluids today and monitor labs in the morning. 3. Unlikely pyelonephritis-urinalysis negative, no fevers, hold antibiotics-received one dose of Rocephin at skilled facility-monitor temperature curve. If he becomes hypo-thermic or hyperthermic, will restart Rocephin. 4. 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 5. Celiac disease--noncompliant with gluten-free diet 6. Parkinson's disease-nursing instructed to kindly place him on regular schedule of carbidopa 7. Delusional disorder-follow and continue quetiapine 25 twice a day  Code Status: Full Family Communication: [place] [phone], updated 04/05/13 on telephone Disposition Plan: inpatient   Pleas Koch, MD  [facility] Pager [phone] 04/06/2013, 1:50 PM    LOS: 2 days

## 2013-04-07 LAB — BASIC METABOLIC PANEL
BUN: 32 mg/dL — ABNORMAL HIGH (ref 6–23)
Chloride: 110 mEq/L (ref 96–112)
GFR calc Af Amer: >90 mL/min (ref >90)
GFR calc non Af Amer: 84 mL/min — ABNORMAL LOW (ref >90)
Glucose, Bld: 96 mg/dL (ref 70–99)
Potassium: 3.6 mEq/L (ref 3.5–5.1)
Sodium: 141 mEq/L (ref 135–145)

## 2013-04-07 MED ORDER — CIPROFLOXACIN HCL 500 MG PO TABS: 500.0000 mg | ORAL_TABLET | Freq: Two times a day (BID) | ORAL | Status: DC

## 2013-04-07 MED ORDER — QUETIAPINE FUMARATE 25 MG PO TABS: 25.0000 mg | ORAL_TABLET | Freq: Two times a day (BID) | ORAL | Status: DC

## 2013-04-07 MED ORDER — HYDROCODONE-ACETAMINOPHEN 5-325 MG PO TABS: 1.0000 | ORAL_TABLET | Freq: Four times a day (QID) | ORAL | Status: DC | PRN

## 2013-04-07 NOTE — Progress Notes (Signed)
Pt for discharge to [facility].  CSW facilitated pt discharge needs including contacting facility, faxing pt discharge information via [facility], discussing with pt HCPOA, [place] [place], providing RN phone number to call report, and arranging ambulance transport for pt to [place] (Service Request ID#: [id number]).  No further social work needs identified at this time.  CSW signing off.   Jacklynn Lewis, MSW, LCSWA  Clinical Social Work [phone]

## 2013-04-07 NOTE — Discharge Summary (Signed)
Physician Discharge Summary  Steve Sloan ZOX:[id number] DOB: Aug 28, 1945 DOA: 04/04/2013  PCP: Terald Sleeper, MD  Admit date: 04/04/2013 Discharge date: 04/07/2013  Time spent: 25 minutes  Recommendations for Outpatient Follow-up:  1. Recommend 3 more days PO Cipro 500 bid 2. Continue management of Parkinson's with Neurology Dr Marylou Flesher as OP 3. Consider further Derm procedures 4. Needs BMET 4-6 days from now  5. Force oral fluids please  Discharge Diagnoses:  Principal Problem:   AKI (acute kidney injury) Active Problems:   Celiac disease   PD (Parkinson's disease)   Nervous system device, implant, or graft complication   Parkinson's disease (tremor, stiffness, slow motion, unstable posture)   Volume depletion   Protein-calorie malnutrition, severe   Discharge Condition: good  Diet recommendation: regular  Filed Weights   04/04/13 2342  Weight: 66.8 kg (147 lb 4.3 oz)    History of present illness:  67 year old male known Parkinson's, status post brain stimulator, dementia, sprue, prior colectomy causing diarrhea-admitted 04/04/13 with lethargy, dehydration and uremia.    Hospital Course:   1. Toxic metabolic encephalopathy-DDx 2/2 to Percocet vs. changing the schedule of his anti-Parkinson's drugs.  His Abx were initially held as it was not felt based on rpt UA that he had Pyelonephritis, however, as he had hypothermia and mild confusion 12/11, this was addressed with Cipro 500 bid .  Much more lucid and at baseline state on 04/07/13 2. Pyelo?-Cultures neg but received Rocephin x1 at [facility].  cotinue till 12/15 3. Acute kidney injury secondary to poor by mouth intake-repleted with saline. Continue overnight and monitor labs in the morning-creatinine now trending down to 39/0.92, discontinue IV fluids today and monitor labs in the morning. 4. 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 5. Celiac disease--noncompliant with gluten-free diet 6. Parkinson's disease-nursing instructed to kindly place him on regular schedule of carbidopa.  He will need regular dosing and follow up with his Neurologist Dr. Marylou Flesher 7. Delusional disorder-follow and continue quetiapine 25 twice a day   Consultants:  None Procedures:  None Antibiotics:  Cipro 500 bid 12/11-->12/15  Discharge Exam: Filed Vitals:   04/07/13 0415  BP: 106/58  Pulse: 54  Temp: 98.2 F (36.8 C)  Resp: 16   Alert pleasant oriented NAd General: eomi, responds appropriately Cardiovascular: s1 s2 no m/r/g Respiratory:  clear  Discharge Instructions  Discharge Orders   Future Appointments Provider Department Dept Phone   05/11/2013 10:15 AM Omelia Blackwater, DO [name] [phone]   Future Orders Complete By Expires   Diet - low sodium heart healthy  As directed    Increase activity slowly  As directed        Medication List         amantadine 100 MG capsule  Commonly known as:  SYMMETREL  Take 100 mg by mouth daily.     carbidopa-levodopa 50-200 MG per tablet  Commonly known as:  SINEMET CR  Take 1 tablet by mouth 4 (four) times daily.     cholestyramine 4 G packet  Commonly known as:  QUESTRAN  Take 1 packet by mouth every morning.     ciprofloxacin 500 MG tablet  Commonly known as:  CIPRO  Take 1 tablet (500 mg total) by mouth 2 (two) times daily.     diphenoxylate-atropine 2.5-0.025 MG per tablet  Commonly known as:  LOMOTIL  Take 2 tablets by mouth 2 (two) times daily.  ferrous sulfate 325 (65 FE) MG tablet  Take 325 mg by mouth 2 (two) times daily with a meal.     HYDROcodone-acetaminophen 5-325 MG per tablet  Commonly known as:  NORCO/VICODIN  Take 1 tablet by mouth every 6 (six) hours as needed for moderate pain.     hydroxypropyl methylcellulose 2.5 % ophthalmic solution  Commonly known as:  ISOPTO TEARS  Place 1  drop into both eyes 2 (two) times daily.     loperamide 2 MG capsule  Commonly known as:  IMODIUM  Take 2 mg by mouth 2 (two) times daily.     OPTIVE SENSITIVE 0.5-0.9 % Soln  Generic drug:  Carboxymethylcellul-Glycerin  Place 1 drop into both eyes 2 (two) times daily.     QUEtiapine 25 MG tablet  Commonly known as:  SEROQUEL  Take 25 mg by mouth 2 (two) times daily.     saccharomyces boulardii 250 MG capsule  Commonly known as:  FLORASTOR  Take 250 mg by mouth daily.     tobramycin-dexamethasone ophthalmic ointment  Commonly known as:  TOBRADEX  Place 1 application into both eyes 2 (two) times daily.     Vitamin D (Ergocalciferol) 50000 UNITS Caps capsule  Commonly known as:  DRISDOL  Take 50,000 Units by mouth every 30 (thirty) days. On the 25th       Allergies  Allergen Reactions  . Donepezil     Unknown       The results of significant diagnostics from this hospitalization (including imaging, microbiology, ancillary and laboratory) are listed below for reference.    Significant Diagnostic Studies: Dg Chest 1 View  04/04/2013   CLINICAL DATA:  Weakness, hypotension  EXAM: CHEST - 1 VIEW  COMPARISON:  12/14/2012  FINDINGS: Cardiomediastinal silhouette is stable. No acute infiltrate or pleural effusion. No pulmonary edema. Old left rib fracture again noted.  IMPRESSION: No active disease.   Electronically Signed   By: Natasha Mead M.D.   On: 04/04/2013 17:35   Ct Head Wo Contrast  04/04/2013   CLINICAL DATA:  Recent battery replacement for a brain stimulator. Lethargy.  EXAM: CT HEAD WITHOUT CONTRAST  TECHNIQUE: Contiguous axial images were obtained from the base of the skull through the vertex without intravenous contrast.  COMPARISON:  Head CT scan 06/16/2012.  FINDINGS: Brain stimulator device is again seen with its tip in the right thalamus/midbrain, unchanged. No evidence of acute abnormality including infarction, hemorrhage, mass lesion, mass effect, midline shift or  abnormal extra-axial fluid collection is identified. No hydrocephalus or pneumocephalus. Mild atrophy and chronic microvascular ischemic change are again seen. Bifrontal burr holes are identified.  IMPRESSION: No acute abnormality.  Stable compared to prior exam.   Electronically Signed   By: Drusilla Kanner M.D.   On: 04/04/2013 17:38    Microbiology: Recent Results (from the past 240 hour(s))  MRSA PCR SCREENING     Status: None   Collection Time    04/05/13  6:24 PM      Result Value Range Status   MRSA by PCR NEGATIVE  NEGATIVE Final   Comment:            The GeneXpert MRSA Assay (FDA     approved for NASAL specimens     only), is one component of a     comprehensive MRSA colonization     surveillance program. It is not     intended to diagnose MRSA     infection nor to guide or  monitor treatment for     MRSA infections.     Labs: Basic Metabolic Panel:  Recent Labs Lab 04/04/13 1645 04/05/13 0800 04/06/13 1159 04/07/13 0422  NA 131* 134* 140 141  K 5.1 4.4 3.8 3.6  CL 99 102 110 110  CO2 17* 19 23 23   GLUCOSE 112* 95 105* 96  BUN 91* 73* 39* 32*  CREATININE 1.79* 1.28 0.92 0.93  CALCIUM 10.2 9.3 8.6 8.7   Liver Function Tests:  Recent Labs Lab 04/04/13 1645  AST 25  ALT 11  ALKPHOS 135*  BILITOT 0.6  PROT 9.0*  ALBUMIN 4.8   No results found for this basename: LIPASE, AMYLASE,  in the last 168 hours No results found for this basename: AMMONIA,  in the last 168 hours CBC:  Recent Labs Lab 04/04/13 1645 04/05/13 0800  WBC 10.0 7.5  NEUTROABS 8.5*  --   HGB 14.1 13.8  HCT 40.6 38.3*  MCV 88.8 88.5  PLT 202 PLATELET CLUMPS NOTED ON SMEAR, UNABLE TO ESTIMATE   Cardiac Enzymes: No results found for this basename: CKTOTAL, CKMB, CKMBINDEX, TROPONINI,  in the last 168 hours BNP: BNP (last 3 results) No results found for this basename: PROBNP,  in the last 8760 hours CBG: No results found for this basename: GLUCAP,  in the last 168  hours     Signed:  Rhetta Mura  [facility] 04/07/2013, 9:14 AM

## 2013-04-07 NOTE — Progress Notes (Signed)
Pt. Was discharged back to [facility]. Transportation was arranged and his discharge packet and prescriptions were sent with transportation at discharge.

## 2013-04-11 ENCOUNTER — Non-Acute Institutional Stay (SKILLED_NURSING_FACILITY): Payer: Medicare Other | Admitting: Internal Medicine

## 2013-04-11 ENCOUNTER — Other Ambulatory Visit: Payer: Self-pay | Admitting: *Deleted

## 2013-04-11 DIAGNOSIS — G20 Parkinson's disease: Secondary | ICD-10-CM

## 2013-04-11 DIAGNOSIS — E860 Dehydration: Secondary | ICD-10-CM

## 2013-04-11 MED ORDER — DIPHENOXYLATE-ATROPINE 2.5-0.025 MG PO TABS: ORAL_TABLET | ORAL | Status: DC

## 2013-04-11 NOTE — Progress Notes (Signed)
Patient ID: Steve Sloan, male   DOB: [birth year], 67 y.o.   MRN: [id number] Facility; [facility] Chief complaint; readmission to the facility. Post they had [place] December 9 through December 12 History; I have seen this patient on the day he was sent out to. He was listless, overtly dehydrated and did not look at all well. We does not start an IV on him in the facility however we did give him a single dose of Rocephin. In the hospital he was rehydrated the. His blood culture and urine culture were negative however this may have been affected by the single dose of Rocephin he received here  Lethargy.   EXAM: CT HEAD WITHOUT CONTRAST   TECHNIQUE: Contiguous axial images were obtained from the base of the skull through the vertex without intravenous contrast.   COMPARISON:  Head CT scan 06/16/2012.   FINDINGS: Brain stimulator device is again seen with its tip in the right thalamus/midbrain, unchanged. No evidence of acute abnormality including infarction, hemorrhage, mass lesion, mass effect, midline shift or abnormal extra-axial fluid collection is identified. No hydrocephalus or pneumocephalus. Mild atrophy and chronic microvascular ischemic change are again seen. Bifrontal burr holes are identified.   IMPRESSION: No acute abnormality.  Stable compared to prior exam.   change-left eyebrow seems to have undergone Mohs microsurgery Neuro mild tremor  Data Reviewed: Basic Metabolic Panel:   Recent Labs Lab  04/04/13 1645  04/05/13 0800   NA  131*  134*   K  5.1  4.4   CL  99  102   CO2  17*  19   GLUCOSE  112*  95   BUN  91*  73*   CREATININE  1.79*  1.28   CALCIUM  10.2  9.3    Liver Function Tests:   Recent Labs Lab  04/04/13 1645   AST  25   ALT  11   ALKPHOS  135*   BILITOT  0.6   PROT  9.0*   ALBUMIN  4.8    No results found for this basename: LIPASE, AMYLASE,  in the last 168 hours No results found for this basename: AMMONIA,  in the last 168  hours CBC:   Recent Labs Lab  04/04/13 1645  04/05/13 0800   WBC  10.0  7.5   NEUTROABS  8.5*   --    HGB  14.1  13.8   HCT  40.6  38.3*   MCV  88.8  88.5   PLT  202  PLATELET CLUMPS NOTED ON SMEAR, UNABLE TO ESTIMATE     has a past medical history of Parkinson's disease; Hypotension; Brain tumor; Encounter for intubation; Dysphagia; Enterocutaneous fistula; Chronic diarrhea; Anemia; Anal fistula; Cancer; Dementia; Nervous system device, implant, or graft complication (03/08/2013); and Post-operative state (03/08/2013). Possible celiac disease.  Past Surgical History  Procedure Laterality Date  . Pacemaker insertion    . Deep brain stimulator placement      with removal of brain cells  . Colectomy    . Subthalamic stimulator battery replacement N/A 12/22/2012    Procedure: Deep brain stimulator battery change;  Surgeon: Maeola Harman, MD;  Location: [facility];  Service: Neurosurgery;  Laterality: N/A;   Current Outpatient Prescriptions on File Prior to Visit  Medication Sig Dispense Refill  . amantadine (SYMMETREL) 100 MG capsule Take 100 mg by mouth daily.      . carbidopa-levodopa (SINEMET CR) 50-200 MG per tablet Take 1 tablet by mouth 4 (four)  times daily.      . Carboxymethylcellul-Glycerin (OPTIVE SENSITIVE) 0.5-0.9 % SOLN Place 1 drop into both eyes 2 (two) times daily.      . cholestyramine (QUESTRAN) 4 G packet Take 1 packet by mouth every morning.       . ciprofloxacin (CIPRO) 500 MG tablet Take 1 tablet (500 mg total) by mouth 2 (two) times daily.  6 tablet  0  . diphenoxylate-atropine (LOMOTIL) 2.5-0.025 MG per tablet Take two tablets by mouth twice dailyy  120 tablet  5  . ferrous sulfate 325 (65 FE) MG tablet Take 325 mg by mouth 2 (two) times daily with a meal.       . HYDROcodone-acetaminophen (NORCO/VICODIN) 5-325 MG per tablet Take 1 tablet by mouth every 6 (six) hours as needed for moderate pain.  30 tablet  0  . hydroxypropyl methylcellulose (ISOPTO TEARS) 2.5  % ophthalmic solution Place 1 drop into both eyes 2 (two) times daily.      Marland Kitchen loperamide (IMODIUM) 2 MG capsule Take 2 mg by mouth 2 (two) times daily.      . QUEtiapine (SEROQUEL) 25 MG tablet Take 1 tablet (25 mg total) by mouth 2 (two) times daily.  30 tablet  0  . saccharomyces boulardii (FLORASTOR) 250 MG capsule Take 250 mg by mouth daily.      Marland Kitchen tobramycin-dexamethasone (TOBRADEX) ophthalmic ointment Place 1 application into both eyes 2 (two) times daily.      . Vitamin D, Ergocalciferol, (DRISDOL) 50000 UNITS CAPS capsule Take 50,000 Units by mouth every 30 (thirty) days. On the 25th       No current facility-administered medications on file prior to visit.   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 Is supposed to be on a gluten-free diet  Physical examination Gen. alert responsive and up in his wheelchair Respiratory clear entry bilaterally Cardiac appears to be euvolemic otherwise normal exam Abdomen ileostomy, no tenderness no guarding no rebound GU bladder is not overtly distended no tenderness no CVA tenderness Neurologic; today seeing him at 1:00 in the afternoon his symptoms don't seem that bad he has minimal rigidity no tremor pushing himself in a wheelchair Skin; ear appears to be 2 nonhealing areas on the right side of his nose and also just anterior to his right ear. He may need to see a dermatologist if he has not been followed already  Impression/plan #1 dehydration he received IV fluids in the hospital [see labs above]. I thought he might have a UTI, his cultures were negative in the hospital however he did receive a dose of Rocephin #2 ileostomy this may have contributed to his excessive fluid outlet. 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 No need to adjust any of his other medications

## 2013-04-14 ENCOUNTER — Emergency Department (HOSPITAL_COMMUNITY)
Admission: EM | Admit: 2013-04-14 | Discharge: 2013-04-14 | Disposition: A | Discharge status: Skilled Nursing Facility | Payer: Medicare Other | Attending: Emergency Medicine | Admitting: Emergency Medicine

## 2013-04-14 ENCOUNTER — Encounter (HOSPITAL_COMMUNITY): Payer: Self-pay | Admitting: Emergency Medicine

## 2013-04-14 DIAGNOSIS — H5440 Blindness, one eye, unspecified eye: Secondary | ICD-10-CM | POA: Insufficient documentation

## 2013-04-14 DIAGNOSIS — Z950 Presence of cardiac pacemaker: Secondary | ICD-10-CM | POA: Insufficient documentation

## 2013-04-14 DIAGNOSIS — G20A1 Parkinson's disease without dyskinesia, without mention of fluctuations: Secondary | ICD-10-CM | POA: Insufficient documentation

## 2013-04-14 DIAGNOSIS — Z9889 Other specified postprocedural states: Secondary | ICD-10-CM | POA: Insufficient documentation

## 2013-04-14 DIAGNOSIS — Z86011 Personal history of benign neoplasm of the brain: Secondary | ICD-10-CM | POA: Insufficient documentation

## 2013-04-14 DIAGNOSIS — Z79899 Other long term (current) drug therapy: Secondary | ICD-10-CM | POA: Insufficient documentation

## 2013-04-14 DIAGNOSIS — R55 Syncope and collapse: Secondary | ICD-10-CM | POA: Insufficient documentation

## 2013-04-14 DIAGNOSIS — I10 Essential (primary) hypertension: Secondary | ICD-10-CM | POA: Insufficient documentation

## 2013-04-14 DIAGNOSIS — D649 Anemia, unspecified: Secondary | ICD-10-CM | POA: Insufficient documentation

## 2013-04-14 DIAGNOSIS — G20 Parkinson's disease: Secondary | ICD-10-CM | POA: Insufficient documentation

## 2013-04-14 DIAGNOSIS — Z8719 Personal history of other diseases of the digestive system: Secondary | ICD-10-CM | POA: Insufficient documentation

## 2013-04-14 DIAGNOSIS — Z792 Long term (current) use of antibiotics: Secondary | ICD-10-CM | POA: Insufficient documentation

## 2013-04-14 DIAGNOSIS — F0280 Dementia in other diseases classified elsewhere without behavioral disturbance: Secondary | ICD-10-CM | POA: Insufficient documentation

## 2013-04-14 LAB — CBC WITH DIFFERENTIAL/PLATELET
Basophils Absolute: 0 10*3/uL (ref 0.0–0.1)
Basophils Relative: 0 % (ref 0–1)
Eosinophils Absolute: 0.1 10*3/uL (ref 0.0–0.7)
HCT: 34.9 % — ABNORMAL LOW (ref 39.0–52.0)
Hemoglobin: 11.7 g/dL — ABNORMAL LOW (ref 13.0–17.0)
Lymphocytes Relative: 30 % (ref 12–46)
Lymphs Abs: 1.1 10*3/uL (ref 0.7–4.0)
MCH: 30.8 pg (ref 26.0–34.0)
MCHC: 33.5 g/dL (ref 30.0–36.0)
Monocytes Absolute: 0.3 10*3/uL (ref 0.1–1.0)
Monocytes Relative: 7 % (ref 3–12)
Neutro Abs: 2.2 10*3/uL (ref 1.7–7.7)
Neutrophils Relative %: 61 % (ref 43–77)
Platelets: 123 10*3/uL — ABNORMAL LOW (ref 150–400)
RBC: 3.8 MIL/uL — ABNORMAL LOW (ref 4.22–5.81)

## 2013-04-14 LAB — BASIC METABOLIC PANEL
BUN: 35 mg/dL — ABNORMAL HIGH (ref 6–23)
CO2: 24 mEq/L (ref 19–32)
Chloride: 105 mEq/L (ref 96–112)
Creatinine, Ser: 1.07 mg/dL (ref 0.50–1.35)
GFR calc non Af Amer: 69 mL/min — ABNORMAL LOW (ref >90)
Glucose, Bld: 94 mg/dL (ref 70–99)
Potassium: 4.2 mEq/L (ref 3.5–5.1)
Sodium: 137 mEq/L (ref 135–145)

## 2013-04-14 LAB — GLUCOSE, CAPILLARY: Glucose-Capillary: 85 mg/dL (ref 70–99)

## 2013-04-14 MED ORDER — SODIUM CHLORIDE 0.9 % IV BOLUS (SEPSIS)
1000.0000 mL | Freq: Once | INTRAVENOUS | Status: AC
  Administered 2013-04-14: 1000 mL via INTRAVENOUS

## 2013-04-14 NOTE — ED Provider Notes (Signed)
CSN: [id number]     Arrival date & time 04/14/13  0436 History   First MD Initiated Contact with Patient 04/14/13 0503     Chief Complaint  Patient presents with  . Near Syncope   (Consider location/radiation/quality/duration/timing/severity/associated sxs/prior Treatment) HPI Chronic diarrhea, dementia, Parkinson's, sent to ED for apparent brief witnessed staring spell just PTA while he was having diarrhea, no obvious seizure activity noted, Pt amnesia for event, Pt denies complaints, denies CP/SOB/abdominal pain/focal weak/numb.   Past Medical History  Diagnosis Date  . Parkinson's disease   . Hypotension   . Brain tumor   . Encounter for intubation   . Dysphagia   . Enterocutaneous fistula   . Chronic diarrhea   . Anemia   . Anal fistula   . Cancer   . Dementia   . Nervous system device, implant, or graft complication 03/08/2013  . Post-operative state 03/08/2013   Past Surgical History  Procedure Laterality Date  . Pacemaker insertion    . Deep brain stimulator placement      with removal of brain cells  . Colectomy    . Subthalamic stimulator battery replacement N/A 12/22/2012    Procedure: Deep brain stimulator battery change;  Surgeon: Maeola Harman, MD;  Location: [facility];  Service: Neurosurgery;  Laterality: N/A;   History reviewed. No pertinent family history. History  Substance Use Topics  . Smoking status: Never Smoker   . Smokeless tobacco: Never Used  . Alcohol Use: No    Review of Systems  Unable to perform ROS: Dementia    Allergies  Donepezil  Home Medications   Current Outpatient Rx  Name  Route  Sig  Dispense  Refill  . amantadine (SYMMETREL) 100 MG capsule   Oral   Take 100 mg by mouth daily.         . carbidopa-levodopa (SINEMET CR) 50-200 MG per tablet   Oral   Take 1 tablet by mouth 4 (four) times daily.         . Carboxymethylcellul-Glycerin (OPTIVE SENSITIVE) 0.5-0.9 % SOLN   Both Eyes   Place 1 drop into both eyes 2  (two) times daily.         . cholestyramine (QUESTRAN) 4 G packet   Oral   Take 1 packet by mouth every morning.          . diphenoxylate-atropine (LOMOTIL) 2.5-0.025 MG per tablet      Take two tablets by mouth twice dailyy   120 tablet   5   . ferrous sulfate 325 (65 FE) MG tablet   Oral   Take 325 mg by mouth 2 (two) times daily with a meal.          . HYDROcodone-acetaminophen (NORCO/VICODIN) 5-325 MG per tablet   Oral   Take 1 tablet by mouth every 6 (six) hours as needed for moderate pain.   30 tablet   0   . hydroxypropyl methylcellulose (ISOPTO TEARS) 2.5 % ophthalmic solution   Both Eyes   Place 1 drop into both eyes 2 (two) times daily.         Marland Kitchen loperamide (IMODIUM) 2 MG capsule   Oral   Take 2 mg by mouth 2 (two) times daily.         . QUEtiapine (SEROQUEL) 25 MG tablet   Oral   Take 1 tablet (25 mg total) by mouth 2 (two) times daily.   30 tablet   0   . saccharomyces boulardii (FLORASTOR)  250 MG capsule   Oral   Take 250 mg by mouth daily.         Marland Kitchen tobramycin-dexamethasone (TOBRADEX) ophthalmic ointment   Both Eyes   Place 1 application into both eyes 2 (two) times daily.         . Vitamin D, Ergocalciferol, (DRISDOL) 50000 UNITS CAPS capsule   Oral   Take 50,000 Units by mouth every 30 (thirty) days. On the 25th         . ciprofloxacin (CIPRO) 500 MG tablet   Oral   Take 1 tablet (500 mg total) by mouth 2 (two) times daily.   6 tablet   0    BP 99/61  Pulse 61  Temp(Src) 97.8 F (36.6 C) (Oral)  Resp 10  SpO2 100% Physical Exam  Nursing note and vitals reviewed. Constitutional:  Awake, alert, nontoxic appearance.  HENT:  Head: Atraumatic.  Eyes: Right eye exhibits no discharge. Left eye exhibits no discharge.  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  Cardiovascular: Normal rate and regular rhythm.   No murmur heard. Pulmonary/Chest: Effort normal and breath sounds normal. No  respiratory distress. He has no wheezes. He has no rales. He exhibits no tenderness.  Abdominal: Soft. Bowel sounds are normal. He exhibits no distension and no mass. There is no tenderness. There is no rebound and no guarding.  Musculoskeletal: He exhibits no tenderness.  Baseline ROM, no obvious new focal weakness.  Neurological: He is alert.  Mental status and motor strength 4/5 appears suspected baseline for patient and situation.No pronator drift in arms or legs; has normal bilateral finger to nose testing  Skin: No rash noted.  Psychiatric: He has a normal mood and affect.    ED Course  Procedures (including critical care time) Pt stable in ED with no significant deterioration in condition. Labs Review Labs Reviewed  CBC WITH DIFFERENTIAL - Abnormal; Notable for the following:    WBC 3.7 (*)    RBC 3.80 (*)    Hemoglobin 11.7 (*)    HCT 34.9 (*)    Platelets 123 (*)    All other components within normal limits  BASIC METABOLIC PANEL - Abnormal; Notable for the following:    BUN 35 (*)    GFR calc non Af Amer 69 (*)    GFR calc Af Amer 80 (*)    All other components within normal limits  GLUCOSE, CAPILLARY   Imaging Review No results found.  EKG Interpretation   None     ECG 0459 not in MUSE at time of interpretation: Sinus rhythm, ventricular rate 61, left bundle branch block, artifact, normal axis no significant change noted compared with 04/05/2003  MDM   1. Near syncope    I doubt any other EMC precluding discharge at this time including, but not necessarily limited to the following:CVA.    Hurman Horn, MD 04/14/13 2114

## 2013-04-14 NOTE — ED Notes (Signed)
[facility] at bedside to transport pt  

## 2013-04-14 NOTE — ED Notes (Signed)
Report per EMS. The situation is not clear. Pt was having a bowel movement with staff present, when he started to "stare off in space", and become "unconsious and conscious at the same time". [facility] staff report that the pt has been having diarrhea "for a while", but unable to say for how long.

## 2013-05-02 ENCOUNTER — Inpatient Hospital Stay (HOSPITAL_COMMUNITY)
Admission: EM | Admit: 2013-05-02 | Discharge: 2013-05-05 | DRG: 682 | Disposition: A | Discharge status: Skilled Nursing Facility | Payer: Medicare Other | Attending: Internal Medicine | Admitting: Internal Medicine

## 2013-05-02 ENCOUNTER — Emergency Department (HOSPITAL_COMMUNITY): Payer: Medicare Other

## 2013-05-02 ENCOUNTER — Encounter (HOSPITAL_COMMUNITY): Payer: Self-pay | Admitting: Emergency Medicine

## 2013-05-02 DIAGNOSIS — Z79899 Other long term (current) drug therapy: Secondary | ICD-10-CM

## 2013-05-02 DIAGNOSIS — K900 Celiac disease: Secondary | ICD-10-CM | POA: Diagnosis present

## 2013-05-02 DIAGNOSIS — Z9889 Other specified postprocedural states: Secondary | ICD-10-CM

## 2013-05-02 DIAGNOSIS — E43 Unspecified severe protein-calorie malnutrition: Secondary | ICD-10-CM | POA: Diagnosis present

## 2013-05-02 DIAGNOSIS — Z9049 Acquired absence of other specified parts of digestive tract: Secondary | ICD-10-CM

## 2013-05-02 DIAGNOSIS — E860 Dehydration: Secondary | ICD-10-CM | POA: Diagnosis present

## 2013-05-02 DIAGNOSIS — Z950 Presence of cardiac pacemaker: Secondary | ICD-10-CM

## 2013-05-02 DIAGNOSIS — E869 Volume depletion, unspecified: Secondary | ICD-10-CM

## 2013-05-02 DIAGNOSIS — F0390 Unspecified dementia without behavioral disturbance: Secondary | ICD-10-CM | POA: Diagnosis present

## 2013-05-02 DIAGNOSIS — G20 Parkinson's disease: Secondary | ICD-10-CM | POA: Diagnosis present

## 2013-05-02 DIAGNOSIS — G20A1 Parkinson's disease without dyskinesia, without mention of fluctuations: Secondary | ICD-10-CM | POA: Diagnosis present

## 2013-05-02 DIAGNOSIS — K562 Volvulus: Secondary | ICD-10-CM

## 2013-05-02 DIAGNOSIS — N179 Acute kidney failure, unspecified: Secondary | ICD-10-CM | POA: Diagnosis present

## 2013-05-02 DIAGNOSIS — T859XXA Unspecified complication of internal prosthetic device, implant and graft, initial encounter: Secondary | ICD-10-CM

## 2013-05-02 DIAGNOSIS — G9340 Encephalopathy, unspecified: Secondary | ICD-10-CM | POA: Diagnosis present

## 2013-05-02 DIAGNOSIS — K912 Postsurgical malabsorption, not elsewhere classified: Secondary | ICD-10-CM | POA: Diagnosis present

## 2013-05-02 DIAGNOSIS — R1310 Dysphagia, unspecified: Secondary | ICD-10-CM | POA: Diagnosis present

## 2013-05-02 DIAGNOSIS — Z681 Body mass index (BMI) 19 or less, adult: Secondary | ICD-10-CM

## 2013-05-02 HISTORY — DX: Celiac disease: K90.0

## 2013-05-02 LAB — CBC WITH DIFFERENTIAL/PLATELET
BASOS ABS: 0 10*3/uL (ref 0.0–0.1)
BASOS PCT: 0 % (ref 0–1)
EOS ABS: 0 10*3/uL (ref 0.0–0.7)
Eosinophils Relative: 0 % (ref 0–5)
HCT: 41.6 % (ref 39.0–52.0)
HEMOGLOBIN: 15.1 g/dL (ref 13.0–17.0)
Lymphocytes Relative: 6 % — ABNORMAL LOW (ref 12–46)
Lymphs Abs: 0.8 10*3/uL (ref 0.7–4.0)
MCH: 32.1 pg (ref 26.0–34.0)
MCHC: 36.3 g/dL — AB (ref 30.0–36.0)
MCV: 88.3 fL (ref 78.0–100.0)
MONOS PCT: 6 % (ref 3–12)
Monocytes Absolute: 0.7 10*3/uL (ref 0.1–1.0)
NEUTROS ABS: 11.4 10*3/uL — AB (ref 1.7–7.7)
Neutrophils Relative %: 88 % — ABNORMAL HIGH (ref 43–77)
Platelets: 203 10*3/uL (ref 150–400)
RBC: 4.71 MIL/uL (ref 4.22–5.81)
RDW: 15.6 % — AB (ref 11.5–15.5)
WBC: 12.9 10*3/uL — ABNORMAL HIGH (ref 4.0–10.5)

## 2013-05-02 LAB — POCT I-STAT, CHEM 8
BUN: 108 mg/dL — AB (ref 6–23)
CHLORIDE: 105 meq/L (ref 96–112)
Calcium, Ion: 1.34 mmol/L — ABNORMAL HIGH (ref 1.13–1.30)
Creatinine, Ser: 2.6 mg/dL — ABNORMAL HIGH (ref 0.50–1.35)
Glucose, Bld: 104 mg/dL — ABNORMAL HIGH (ref 70–99)
HCT: 43 % (ref 39.0–52.0)
Hemoglobin: 14.6 g/dL (ref 13.0–17.0)
Potassium: 5.6 mEq/L — ABNORMAL HIGH (ref 3.7–5.3)
SODIUM: 134 meq/L — AB (ref 137–147)
TCO2: 19 mmol/L (ref 0–100)

## 2013-05-02 LAB — BLOOD GAS, ARTERIAL
ACID-BASE DEFICIT: 11 mmol/L — AB (ref 0.0–2.0)
BICARBONATE: 14 meq/L — AB (ref 20.0–24.0)
Drawn by: 39899
O2 Content: 5 L/min
O2 SAT: 99.4 %
Patient temperature: 98.6
TCO2: 14.9 mmol/L (ref 0–100)
pCO2 arterial: 28.3 mmHg — ABNORMAL LOW (ref 35.0–45.0)
pH, Arterial: 7.314 — ABNORMAL LOW (ref 7.350–7.450)
pO2, Arterial: 177 mmHg — ABNORMAL HIGH (ref 80.0–100.0)

## 2013-05-02 LAB — URINALYSIS, ROUTINE W REFLEX MICROSCOPIC
BILIRUBIN URINE: NEGATIVE
Glucose, UA: NEGATIVE mg/dL
Hgb urine dipstick: NEGATIVE
Ketones, ur: NEGATIVE mg/dL
Leukocytes, UA: NEGATIVE
NITRITE: NEGATIVE
Protein, ur: NEGATIVE mg/dL
SPECIFIC GRAVITY, URINE: 1.021 (ref 1.005–1.030)
UROBILINOGEN UA: 0.2 mg/dL (ref 0.0–1.0)
pH: 5 (ref 5.0–8.0)

## 2013-05-02 LAB — CBC
HCT: 42.2 % (ref 39.0–52.0)
Hemoglobin: 15.4 g/dL (ref 13.0–17.0)
MCH: 32.2 pg (ref 26.0–34.0)
MCHC: 36.5 g/dL — ABNORMAL HIGH (ref 30.0–36.0)
MCV: 88.3 fL (ref 78.0–100.0)
PLATELETS: 194 10*3/uL (ref 150–400)
RBC: 4.78 MIL/uL (ref 4.22–5.81)
RDW: 15.6 % — AB (ref 11.5–15.5)
WBC: 11.9 10*3/uL — AB (ref 4.0–10.5)

## 2013-05-02 LAB — MRSA PCR SCREENING: MRSA by PCR: NEGATIVE

## 2013-05-02 LAB — TROPONIN I: Troponin I: <0.3 ng/mL (ref <0.30)

## 2013-05-02 LAB — MAGNESIUM: Magnesium: 2.4 mg/dL (ref 1.5–2.5)

## 2013-05-02 LAB — POTASSIUM: POTASSIUM: 6.1 meq/L — AB (ref 3.7–5.3)

## 2013-05-02 MED ORDER — TOBRAMYCIN-DEXAMETHASONE 0.3-0.1 % OP OINT
1.0000 "application " | TOPICAL_OINTMENT | Freq: Two times a day (BID) | OPHTHALMIC | Status: DC
  Administered 2013-05-03 – 2013-05-05 (×6): 1 via OPHTHALMIC
  Filled 2013-05-02: qty 3.5

## 2013-05-02 MED ORDER — DOCUSATE SODIUM 100 MG PO CAPS
100.0000 mg | ORAL_CAPSULE | Freq: Two times a day (BID) | ORAL | Status: DC
  Administered 2013-05-03 – 2013-05-05 (×5): 100 mg via ORAL
  Filled 2013-05-02 (×8): qty 1

## 2013-05-02 MED ORDER — QUETIAPINE FUMARATE 25 MG PO TABS
25.0000 mg | ORAL_TABLET | Freq: Two times a day (BID) | ORAL | Status: DC
  Administered 2013-05-02 – 2013-05-05 (×6): 25 mg via ORAL
  Filled 2013-05-02 (×7): qty 1

## 2013-05-02 MED ORDER — AMANTADINE HCL 100 MG PO CAPS
100.0000 mg | ORAL_CAPSULE | Freq: Every day | ORAL | Status: DC
  Administered 2013-05-03 – 2013-05-05 (×3): 100 mg via ORAL
  Filled 2013-05-02 (×3): qty 1

## 2013-05-02 MED ORDER — SODIUM CHLORIDE 0.9 % IV BOLUS (SEPSIS)
1000.0000 mL | Freq: Once | INTRAVENOUS | Status: AC
  Administered 2013-05-02: 1000 mL via INTRAVENOUS

## 2013-05-02 MED ORDER — SODIUM CHLORIDE 0.9 % IJ SOLN
3.0000 mL | Freq: Two times a day (BID) | INTRAMUSCULAR | Status: DC
  Administered 2013-05-02 – 2013-05-03 (×3): 3 mL via INTRAVENOUS

## 2013-05-02 MED ORDER — ENOXAPARIN SODIUM 30 MG/0.3ML ~~LOC~~ SOLN
30.0000 mg | SUBCUTANEOUS | Status: DC
  Administered 2013-05-02 – 2013-05-04 (×3): 30 mg via SUBCUTANEOUS
  Filled 2013-05-02 (×4): qty 0.3

## 2013-05-02 MED ORDER — ACETAMINOPHEN 325 MG PO TABS
650.0000 mg | ORAL_TABLET | Freq: Four times a day (QID) | ORAL | Status: DC | PRN
Start: 2013-05-02 — End: 2013-05-05

## 2013-05-02 MED ORDER — CARBIDOPA-LEVODOPA ER 50-200 MG PO TBCR
1.0000 | EXTENDED_RELEASE_TABLET | Freq: Four times a day (QID) | ORAL | Status: DC
  Administered 2013-05-02 – 2013-05-05 (×11): 1 via ORAL
  Filled 2013-05-02 (×13): qty 1

## 2013-05-02 MED ORDER — SODIUM POLYSTYRENE SULFONATE 15 GM/60ML PO SUSP
15.0000 g | Freq: Once | ORAL | Status: AC
  Administered 2013-05-02: 15 g via RECTAL
  Filled 2013-05-02: qty 60

## 2013-05-02 MED ORDER — SACCHAROMYCES BOULARDII 250 MG PO CAPS
250.0000 mg | ORAL_CAPSULE | Freq: Every day | ORAL | Status: DC
  Administered 2013-05-03 – 2013-05-05 (×3): 250 mg via ORAL
  Filled 2013-05-02 (×3): qty 1

## 2013-05-02 MED ORDER — SODIUM CHLORIDE 0.9 % IV SOLN
INTRAVENOUS | Status: AC
  Administered 2013-05-02 – 2013-05-03 (×3): via INTRAVENOUS

## 2013-05-02 MED ORDER — LEVALBUTEROL HCL 0.63 MG/3ML IN NEBU: 0.6300 mg | INHALATION_SOLUTION | Freq: Four times a day (QID) | RESPIRATORY_TRACT | Status: DC | PRN

## 2013-05-02 MED ORDER — HYPROMELLOSE (GONIOSCOPIC) 2.5 % OP SOLN
1.0000 [drp] | Freq: Two times a day (BID) | OPHTHALMIC | Status: DC
  Filled 2013-05-02: qty 15

## 2013-05-02 MED ORDER — ACETAMINOPHEN 650 MG RE SUPP: 650.0000 mg | Freq: Four times a day (QID) | RECTAL | Status: DC | PRN

## 2013-05-02 NOTE — ED Notes (Signed)
Pt back from xray and asked to use the urinal

## 2013-05-02 NOTE — ED Notes (Signed)
Per [facility], pt from [place] and rehab for AMS that started today. Family is in the process of getting a DNR for the patient. Hx of brain tumor. Pt has been lethargic today. 24g to LH and 20g to LFA. NS infusing to LFA. Placed on [place] at 2 liters.

## 2013-05-02 NOTE — H&P (Addendum)
[facility] History and Physical  Steve Sloan [id number] DOB: Sep 15, 1945 DOA: 05/02/2013  Referring physician:  PCP: Cyndee Brightly, MD   Chief Complaint: Altered mental status  HPI:  68 year old male known Parkinson's, status post brain stimulator, dementia, sprue, prior colectomy with chronic diarrhea-admitted 04/04/13 with lethargy, dehydration and uremia, today sent from [facility] for altered mental status, dehydration. No reported fever, but decreased by mouth intake, increasing lethargy over the last couple of days. They attempted to hydrate him with IV fluids at the nursing home, however the patient pulled out his IV. In the ER the patient was found to be clinically dehydrated with a creatinine of greater than 2. He is obtunded unable to do any questions appropriately. UA negative. Chest x-ray negative. CT head negative  During his admission on 12/9, He was found to have toxic metabolic encephalopathy secondary to UTI treated with ciprofloxacin. He was also found to have acute kidney injury and required IV hydration. 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 In his living will the patient does not want to have tube feeds, has a history of pretty significant oropharyngeal dysphagia. Has a history of recurrent aspiration pneumonia, chronic malnutrition. He is insistent upon regular diet and thin liquids.           Review of Systems: negative for the following  Constitutional: As in history of present illness HEENT: Denies photophobia, eye pain, redness, hearing loss, ear pain, congestion, sore throat, rhinorrhea, sneezing, mouth sores, trouble swallowing, neck pain, neck stiffness and tinnitus.  Respiratory: Denies SOB, DOE, cough, chest tightness, and wheezing.  Cardiovascular: Denies chest pain, palpitations and leg swelling.  Gastrointestinal: Denies nausea,  vomiting, abdominal pain, diarrhea, constipation, blood in stool and abdominal distention.  Genitourinary: Denies dysuria, urgency, frequency, hematuria, flank pain and difficulty urinating.  Musculoskeletal: Denies myalgias, back pain, joint swelling, arthralgias and gait problem.  Skin: Denies pallor, rash and wound.  Neurological: As in history of present illness Hematological: Denies adenopathy. Easy bruising, personal or family bleeding history  Psychiatric/Behavioral: Denies suicidal ideation, mood changes, confusion, nervousness, sleep disturbance and agitation       Past Medical History  Diagnosis Date  . Parkinson's disease   . Hypotension   . Brain tumor   . Encounter for intubation   . Dysphagia   . Enterocutaneous fistula   . Chronic diarrhea   . Anemia   . Anal fistula   . Cancer   . Dementia   . Nervous system device, implant, or graft complication [id number]  . Post-operative state 03/08/2013  . Celiac sprue      Past Surgical History  Procedure Laterality Date  . Pacemaker insertion    . Deep brain stimulator placement      with removal of brain cells  . Colectomy    . Subthalamic stimulator battery replacement N/A 12/22/2012    Procedure: Deep brain stimulator battery change;  Surgeon: Erline Levine, MD;  Location: [facility];  Service: Neurosurgery;  Laterality: N/A;      Social History:  reports that he has never smoked. He has never used smokeless tobacco. He reports that he does not drink alcohol or use illicit drugs.     Allergies  Allergen Reactions  . Donepezil     Unknown     No family history on file.   Prior to Admission medications   Medication Sig Start Date End Date Taking? Authorizing Provider  amantadine (SYMMETREL) 100 MG capsule Take 100 mg by mouth daily.   Yes Historical Provider, MD  carbidopa-levodopa (SINEMET CR) 50-200 MG per tablet Take 1 tablet by mouth 4 (four) times daily.   Yes Historical Provider, MD   Carboxymethylcellul-Glycerin (OPTIVE SENSITIVE) 0.5-0.9 % SOLN Place 1 drop into both eyes 2 (two) times daily.   Yes Historical Provider, MD  cholestyramine Lucrezia Starch) 4 G packet Take 1 packet by mouth every morning.    Yes Historical Provider, MD  diphenoxylate-atropine (LOMOTIL) 2.5-0.025 MG per tablet Take two tablets by mouth twice dailyy 04/11/13  Yes Mahima Bubba Camp, MD  ferrous sulfate 325 (65 FE) MG tablet Take 325 mg by mouth 2 (two) times daily with a meal.    Yes Historical Provider, MD  HYDROcodone-acetaminophen (NORCO/VICODIN) 5-325 MG per tablet Take 1 tablet by mouth every 6 (six) hours as needed for moderate pain. 04/07/13  Yes Nita Sells, MD  hydroxypropyl methylcellulose (ISOPTO TEARS) 2.5 % ophthalmic solution Place 1 drop into both eyes 2 (two) times daily.   Yes Historical Provider, MD  loperamide (IMODIUM) 2 MG capsule Take 2 mg by mouth 2 (two) times daily.   Yes Historical Provider, MD  QUEtiapine (SEROQUEL) 25 MG tablet Take 1 tablet (25 mg total) by mouth 2 (two) times daily. 04/07/13  Yes Nita Sells, MD  saccharomyces boulardii (FLORASTOR) 250 MG capsule Take 250 mg by mouth daily.   Yes Historical Provider, MD  tobramycin-dexamethasone [facility]) ophthalmic ointment Place 1 application into both eyes 2 (two) times daily.   Yes Historical Provider, MD  Vitamin D, Ergocalciferol, (DRISDOL) 50000 UNITS CAPS capsule Take 50,000 Units by mouth every 30 (thirty) days. On the 25th   Yes Historical Provider, MD     Physical Exam: Filed Vitals:   05/02/13 1534 05/02/13 1700 05/02/13 1730  BP: 105/74 120/71 105/77  Pulse: 73    Temp: 98.6 F (37 C)    TempSrc: Rectal    Resp: 11 14 16   SpO2: 99%       Constitutional: Vital signs reviewed. Patient is a well-developed and well-nourished in no acute distress and cooperative with exam. Alert and oriented x3.  Head: Normocephalic and atraumatic  Ear: TM normal bilaterally  Mouth: no erythema or exudates,  MMM  Eyes: PERRL, EOMI, conjunctivae normal, No scleral icterus.  Neck: Supple, Trachea midline normal ROM, No JVD, mass, thyromegaly, or carotid bruit present.  Cardiovascular: RRR, S1 normal, S2 normal, no MRG, pulses symmetric and intact bilaterally  Pulmonary/Chest: CTAB, no wheezes, rales, or rhonchi  Abdominal: Soft. Non-tender, non-distended, bowel sounds are normal, no masses, organomegaly, or guarding present.  GU: no CVA tenderness Musculoskeletal: No joint deformities, erythema, or stiffness, ROM full and no nontender Ext: no edema and no cyanosis, pulses palpable bilaterally (DP and PT)  Hematology: no cervical, inginal, or axillary adenopathy.  Neurological: Obtunded, Strenght is normal and symmetric bilaterally, cranial nerve II-XII are grossly intact, no focal motor deficit, sensory intact to light touch bilaterally.  Skin: Warm, dry and intact. No rash, cyanosis, or clubbing.  Psychiatric: Normal mood and affect. speech and behavior is normal. Judgment and thought content normal. Cognition and memory are normal.       Labs on Admission:    Basic Metabolic Panel:  Recent Labs Lab 05/02/13 1632  NA 134*  K 5.6*  CL 105  GLUCOSE 104*  BUN 108*  CREATININE 2.60*   Liver Function Tests: No results found for this basename: AST, ALT, ALKPHOS, BILITOT, PROT, ALBUMIN,  in the last 168 hours No results found for this basename: LIPASE, AMYLASE,  in the last 168 hours No results found for this basename: AMMONIA,  in the last 168 hours CBC:  Recent Labs Lab 05/02/13 1615 05/02/13 1632  WBC 12.9*  --   NEUTROABS 11.4*  --   HGB 15.1 14.6  HCT 41.6 43.0  MCV 88.3  --   PLT 203  --    Cardiac Enzymes: No results found for this basename: CKTOTAL, CKMB, CKMBINDEX, TROPONINI,  in the last 168 hours  BNP (last 3 results) No results found for this basename: PROBNP,  in the last 8760 hours    CBG: No results found for this basename: GLUCAP,  in the last 168  hours  Radiological Exams on Admission: Dg Chest 2 View  05/02/2013   CLINICAL DATA:  Altered mental status, history of brain tumor  EXAM: CHEST  2 VIEW  COMPARISON:  April 04, 2013  FINDINGS: The lungs are well-expanded and clear. The cardiopericardial silhouette is normal in size. The pulmonary vascularity is not engorged. The mediastinum is normal in width. There is no pleural effusion. The observed portions of the bony thorax exhibit no acute abnormalities. There are degenerative changes of the right shoulder and there is an old fracture of the left 6th rib which has healed with deformity. A pacemaker generator without electrodes is again demonstrated.  IMPRESSION: There are no findings to suggest CHF nor pneumonia. There is no evidence of significant atelectasis. No other acute cardiopulmonary abnormality is demonstrated either.   Electronically Signed   By: David  Martinique   On: 05/02/2013 17:01    EKG: Independently reviewed.  Assessment/Plan  Altered mental status  Likely secondary to azotemia, dehydration End-stage dementia No signs of infection We'll check an ABG Hold sedating/narcotic medications   Parkinson's disease Continue meds by mouth with alert enough Tried to reach power of attorney listed below Left them a voicemail, please contact power of attorney again in the morning Because of recurrent ED visits and hospitalizations the patient should be a DO NOT RESUSCITATE He does not want tube  feeding   Acute kidney injury Hydrate with IV fluids, Likely due to the recent events including UTI, chronic diarrhea, multiple medications He is at risk of dehydration given his oropharyngeal dysphagia    POA : Steve Sloan : [phone]                [phone]   Code Status:   full Family Communication: bedside Disposition Plan: admit   Time spent: 70 mins   [facility] Pager [phone]  If 7PM-7AM, please contact  night-coverage [email or web address] Password [facility] 05/02/2013, 6:38 PM

## 2013-05-02 NOTE — Progress Notes (Signed)
Writer of this note unable to gather pertinent information- pt is alert to self only.

## 2013-05-02 NOTE — ED Provider Notes (Signed)
CSN: [id number]     Arrival date & time 05/02/13  1525 History   First MD Initiated Contact with Patient 05/02/13 1534     Chief Complaint  Patient presents with  . Altered Mental Status   HPI  Steve Sloan is a 68 y.o. male with hx of Parkinson's disease, some dementia, ciliac sprue with frequent diarrhea, dysphagia (but on regular diet), anemia, s/p brain stimulator placement, s/p PPM, presents from [place] and rehab for AMS. Per nursing the patient has had increased lethargy and fatigue over the past few days. It has been a gradual decline. No reported falls. They state he hasn't eating as much and hasn't been participating in activities. Recently completed a course of Cipro for a UTI. Has had ongoing diarrhea. Has had slight cough. Nurse reports no compliance with dysphagia diet. The patient declines complaints.   Past Medical History  Diagnosis Date  . Parkinson's disease   . Hypotension   . Brain tumor   . Encounter for intubation   . Dysphagia   . Enterocutaneous fistula   . Chronic diarrhea   . Anemia   . Anal fistula   . Cancer   . Dementia   . Nervous system device, implant, or graft complication [id number]  . Post-operative state 03/08/2013  . Celiac sprue    Past Surgical History  Procedure Laterality Date  . Pacemaker insertion    . Deep brain stimulator placement      with removal of brain cells  . Colectomy    . Subthalamic stimulator battery replacement N/A 12/22/2012    Procedure: Deep brain stimulator battery change;  Surgeon: Erline Levine, MD;  Location: [facility];  Service: Neurosurgery;  Laterality: N/A;   No family history on file. History  Substance Use Topics  . Smoking status: Never Smoker   . Smokeless tobacco: Never Used  . Alcohol Use: No   Review of Systems  Unable to perform ROS: Other   Allergies  Donepezil  Home Medications   No current outpatient prescriptions on file. BP 129/73  Pulse 65  Temp(Src) 97.3 F (36.3 C)  (Axillary)  Resp 14  Wt 130 lb 15.3 oz (59.4 kg)  SpO2 100% Physical Exam  Constitutional: He appears ill. No distress.  thin  HENT:  Head: Normocephalic and atraumatic.  Eyes: Conjunctivae are normal. Pupils are equal, round, and reactive to light.  Neck: Normal range of motion. Neck supple.  Cardiovascular: Normal rate and regular rhythm.  Exam reveals no gallop and no friction rub.   No murmur heard. Pulmonary/Chest: Effort normal and breath sounds normal.  Abdominal: Soft. He exhibits no distension. There is no tenderness.  Musculoskeletal: Normal range of motion. He exhibits no edema and no tenderness.  Neurological: He is alert.  Skin: Skin is dry.    ED Course  Procedures (including critical care time) Labs Review Labs Reviewed  CBC WITH DIFFERENTIAL - Abnormal; Notable for the following:    WBC 12.9 (*)    MCHC 36.3 (*)    RDW 15.6 (*)    Neutrophils Relative % 88 (*)    Neutro Abs 11.4 (*)    Lymphocytes Relative 6 (*)    All other components within normal limits  POTASSIUM - Abnormal; Notable for the following:    Potassium 6.1 (*)    All other components within normal limits  CBC - Abnormal; Notable for the following:    WBC 11.9 (*)    MCHC 36.5 (*)  RDW 15.6 (*)    All other components within normal limits  COMPREHENSIVE METABOLIC PANEL - Abnormal; Notable for the following:    CO2 18 (*)    BUN 83 (*)    Creatinine, Ser 1.65 (*)    GFR calc non Af Amer 41 (*)    GFR calc Af Amer 48 (*)    All other components within normal limits  CBC - Abnormal; Notable for the following:    HCT 38.6 (*)    RDW 15.6 (*)    All other components within normal limits  BLOOD GAS, ARTERIAL - Abnormal; Notable for the following:    pH, Arterial 7.314 (*)    pCO2 arterial 28.3 (*)    pO2, Arterial 177.0 (*)    Bicarbonate 14.0 (*)    Acid-base deficit 11.0 (*)    All other components within normal limits  POCT I-STAT, CHEM 8 - Abnormal; Notable for the following:     Sodium 134 (*)    Potassium 5.6 (*)    BUN 108 (*)    Creatinine, Ser 2.60 (*)    Glucose, Bld 104 (*)    Calcium, Ion 1.34 (*)    All other components within normal limits  MRSA PCR SCREENING  URINE CULTURE  URINALYSIS, ROUTINE W REFLEX MICROSCOPIC  MAGNESIUM  TSH  TROPONIN I  TROPONIN I  TROPONIN I  INFLUENZA PANEL BY PCR   Imaging Review Dg Chest 2 View  05/02/2013   CLINICAL DATA:  Altered mental status, history of brain tumor  EXAM: CHEST  2 VIEW  COMPARISON:  April 04, 2013  FINDINGS: The lungs are well-expanded and clear. The cardiopericardial silhouette is normal in size. The pulmonary vascularity is not engorged. The mediastinum is normal in width. There is no pleural effusion. The observed portions of the bony thorax exhibit no acute abnormalities. There are degenerative changes of the right shoulder and there is an old fracture of the left 6th rib which has healed with deformity. A pacemaker generator without electrodes is again demonstrated.  IMPRESSION: There are no findings to suggest CHF nor pneumonia. There is no evidence of significant atelectasis. No other acute cardiopulmonary abnormality is demonstrated either.   Electronically Signed   By: David  Martinique   On: 05/02/2013 17:01   Ct Head Wo Contrast  05/02/2013   CLINICAL DATA:  Altered mental status  EXAM: CT HEAD WITHOUT CONTRAST  TECHNIQUE: Contiguous axial images were obtained from the base of the skull through the vertex without intravenous contrast. Study was obtained within 24 hr of patient's arrival at the emergency department.  COMPARISON:  April 04, 2013  FINDINGS: Mild diffuse atrophy is stable. The right-sided brain stimulator lead is positioned in the right subthalamic region, stable. There is no intracranial mass, hemorrhage, extra-axial fluid collection, or midline shift. There is mild small vessel disease in the centra semiovale bilaterally, stable. 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 There is no new gray-white compartment lesion. No demonstrable acute infarct.  There is postoperative change in the vertex region on both sides, stable. Bony calvarium otherwise appears intact. Mastoid air cells are clear.  IMPRESSION: Atrophy with small vessel disease, stable. Postoperative change with brain stimulator lead in right subthalamic region, stable.   Electronically Signed   By: Lowella Grip M.D.   On: 05/02/2013 18:53    EKG Interpretation    Date/Time:  Tuesday May 02 2013 15:36:27 EST Ventricular Rate:  70 PR Interval:  166 QRS Duration: 181 QT Interval:  490 QTC Calculation: 529 R Axis:   -76 Text Interpretation:  Sinus rhythm Atrial premature complexes in couplets Left bundle branch block Artifact in lead(s) I II III aVL aVF V1 V2 V3 V4 V5 V6 and baseline wander in lead(s) III aVL aVF V6 Confirmed by [place] [phone]) on 05/02/2013 4:27:26 PM           MDM  1. Dehydration.   Here with a gradual decline in mental status over the last several days. Not eating as much. No respiratory complaints. Afebrile with stable vital signs. Labs c/w dehydration and ARF. K+ slightly elevated at 5.6. EKG without acute changes. Will recheck after giving fluids. UA and CXR not c/w infection. Likely failure to thrive from dementia and parkinson's disease. IVF given. The patient was admitted to internal medicine for continued rehydration.     Donita Brooks, MD 05/03/13 1329

## 2013-05-02 NOTE — ED Notes (Signed)
Dr. Bringolf at the bedside.  

## 2013-05-02 NOTE — Progress Notes (Signed)
Pt arrived on floor. Alert and oriented to self only. Bed placed on its lowest position, bed alarm on. Call light placed within reached. Will continue to monitor.

## 2013-05-03 DIAGNOSIS — K900 Celiac disease: Secondary | ICD-10-CM

## 2013-05-03 DIAGNOSIS — N179 Acute kidney failure, unspecified: Principal | ICD-10-CM

## 2013-05-03 DIAGNOSIS — Z9889 Other specified postprocedural states: Secondary | ICD-10-CM

## 2013-05-03 DIAGNOSIS — G20 Parkinson's disease: Secondary | ICD-10-CM

## 2013-05-03 DIAGNOSIS — E43 Unspecified severe protein-calorie malnutrition: Secondary | ICD-10-CM

## 2013-05-03 LAB — TROPONIN I: Troponin I: <0.3 ng/mL (ref <0.30)

## 2013-05-03 LAB — CBC
HEMATOCRIT: 38.6 % — AB (ref 39.0–52.0)
Hemoglobin: 13.8 g/dL (ref 13.0–17.0)
MCH: 31.9 pg (ref 26.0–34.0)
MCHC: 35.8 g/dL (ref 30.0–36.0)
MCV: 89.1 fL (ref 78.0–100.0)
Platelets: 173 10*3/uL (ref 150–400)
RBC: 4.33 MIL/uL (ref 4.22–5.81)
RDW: 15.6 % — ABNORMAL HIGH (ref 11.5–15.5)
WBC: 7.8 10*3/uL (ref 4.0–10.5)

## 2013-05-03 LAB — COMPREHENSIVE METABOLIC PANEL
ALBUMIN: 3.9 g/dL (ref 3.5–5.2)
ALK PHOS: 86 U/L (ref 39–117)
AST: 9 U/L (ref 0–37)
BUN: 83 mg/dL — ABNORMAL HIGH (ref 6–23)
CHLORIDE: 104 meq/L (ref 96–112)
CO2: 18 mEq/L — ABNORMAL LOW (ref 19–32)
Calcium: 9 mg/dL (ref 8.4–10.5)
Creatinine, Ser: 1.65 mg/dL — ABNORMAL HIGH (ref 0.50–1.35)
GFR calc Af Amer: 48 mL/min — ABNORMAL LOW (ref >90)
GFR calc non Af Amer: 41 mL/min — ABNORMAL LOW (ref >90)
GLUCOSE: 84 mg/dL (ref 70–99)
POTASSIUM: 4.4 meq/L (ref 3.7–5.3)
Sodium: 139 mEq/L (ref 137–147)
TOTAL PROTEIN: 7.5 g/dL (ref 6.0–8.3)
Total Bilirubin: 0.5 mg/dL (ref 0.3–1.2)

## 2013-05-03 LAB — TSH: TSH: 0.95 u[IU]/mL (ref 0.350–4.500)

## 2013-05-03 LAB — INFLUENZA PANEL BY PCR (TYPE A & B)
H1N1FLUPCR: NOT DETECTED
INFLBPCR: NEGATIVE
Influenza A By PCR: NEGATIVE

## 2013-05-03 MED ORDER — SODIUM CHLORIDE 0.9 % IV BOLUS (SEPSIS)
250.0000 mL | Freq: Once | INTRAVENOUS | Status: AC
  Administered 2013-05-03: 250 mL via INTRAVENOUS

## 2013-05-03 MED ORDER — SODIUM CHLORIDE 0.9 % IV SOLN
INTRAVENOUS | Status: DC
  Administered 2013-05-03 – 2013-05-04 (×2): via INTRAVENOUS

## 2013-05-03 MED ORDER — SODIUM CHLORIDE 0.9 % IV BOLUS (SEPSIS): 250.0000 mL | Freq: Once | INTRAVENOUS | Status: DC

## 2013-05-03 MED ORDER — POLYVINYL ALCOHOL 1.4 % OP SOLN
1.0000 [drp] | Freq: Two times a day (BID) | OPHTHALMIC | Status: DC
  Administered 2013-05-03 – 2013-05-05 (×6): 1 [drp] via OPHTHALMIC
  Filled 2013-05-03: qty 15

## 2013-05-03 NOTE — Progress Notes (Signed)
[facility] PROGRESS NOTE  Steve Sloan SWN:[id number] DOB: 1945/05/27 DOA: 05/02/2013 PCP: Cyndee Brightly, MD  HPI/Subjective: Patient able to converse but unclear speech  Brief history: 68 year old male known Parkinson's, status post brain stimulator, dementia, sprue, prior colectomy with chronic diarrhea-admitted 04/04/13 with lethargy, dehydration and uremia, today sent from [facility] for altered mental status, dehydration. No reported fever, but decreased by mouth intake, increasing lethargy over the last couple of days. They attempted to hydrate him with IV fluids at the nursing home, however the patient pulled out his IV. In the ER the patient was found to be clinically dehydrated with a creatinine of greater than 2. He is obtunded unable to do any questions appropriately. UA negative. Chest x-ray negative. CT head negative  During his admission on 12/9, He was found to have toxic metabolic encephalopathy secondary to UTI treated with ciprofloxacin. He was also found to have acute kidney injury and required IV hydration. 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  In his living will the patient does not want to have tube feeds, has a history of pretty significant oropharyngeal dysphagia. Has a history of recurrent aspiration pneumonia, chronic malnutrition. He is insistent upon regular diet and thin liquids.  Assessment/Plan: Active Problems:   Acute renal failure   Altered mental status -Likely secondary to acute renal failure and dehydration. -Patient has history of dementia likely secondary to Parkinson disease he came in with more confusion. -ABG checked to rule out CO2 narcosis, does not have any evidence of CO2 overload. -Hold sedating/or chronic medications.  Acute renal failure -This is likely secondary to poor oral intake. -Likely due to recent events including a UTI,  chronic diarrhea and multiple medications. -Patient is at risk of the lower dehydration secondary to his history of dementia.  Parkinson disease -Restart her home medications, oral medications to be continued. -Will discuss with his family if they would change his CODE STATUS.  History of dysphagia Patient seen by SLP and recommended regular diet with the liquids.  Code Status: Full code Family Communication: Plan discussed with the patient. Disposition Plan: Remains inpatient   Consultants:  None  Procedures:  None  Antibiotics:  None   Objective: Filed Vitals:   05/03/13 1424  BP: 101/57  Pulse: 102  Temp: 97.6 F (36.4 C)  Resp: 18    Intake/Output Summary (Last 24 hours) at 05/03/13 1710 Last data filed at 05/03/13 1623  Gross per 24 hour  Intake   1000 ml  Output   1000 ml  Net      0 ml   Filed Weights   05/02/13 2015  Weight: 59.4 kg (130 lb 15.3 oz)    Exam: General: Alert and awake, oriented x3, not in any acute distress. HEENT: anicteric sclera, pupils reactive to light and accommodation, EOMI CVS: S1-S2 clear, no murmur rubs or gallops Chest: clear to auscultation bilaterally, no wheezing, rales or rhonchi Abdomen: soft nontender, nondistended, normal bowel sounds, no organomegaly Extremities: no cyanosis, clubbing or edema noted bilaterally Neuro: Cranial nerves II-XII intact, no focal neurological deficits  Data Reviewed: Basic Metabolic Panel:  Recent Labs Lab 05/02/13 1632 05/02/13 1854 05/03/13 0449  NA 134*  --  139  K 5.6* 6.1* 4.4  CL 105  --  104  CO2  --   --  18*  GLUCOSE 104*  --  84  BUN 108*  --  83*  CREATININE  2.60*  --  1.65*  CALCIUM  --   --  9.0  MG  --  2.4  --    Liver Function Tests:  Recent Labs Lab 05/03/13 0449  AST 9  ALT <5  ALKPHOS 86  BILITOT 0.5  PROT 7.5  ALBUMIN 3.9   No results found for this basename: LIPASE, AMYLASE,  in the last 168 hours No results found for this basename:  AMMONIA,  in the last 168 hours CBC:  Recent Labs Lab 05/02/13 1615 05/02/13 1632 05/02/13 2038 05/03/13 0449  WBC 12.9*  --  11.9* 7.8  NEUTROABS 11.4*  --   --   --   HGB 15.1 14.6 15.4 13.8  HCT 41.6 43.0 42.2 38.6*  MCV 88.3  --  88.3 89.1  PLT 203  --  194 173   Cardiac Enzymes:  Recent Labs Lab 05/02/13 1854 05/03/13 0020 05/03/13 0645  TROPONINI <0.30 <0.30 <0.30   BNP (last 3 results) No results found for this basename: PROBNP,  in the last 8760 hours CBG: No results found for this basename: GLUCAP,  in the last 168 hours  Micro Recent Results (from the past 240 hour(s))  MRSA PCR SCREENING     Status: None   Collection Time    05/02/13  8:24 PM      Result Value Range Status   MRSA by PCR NEGATIVE  NEGATIVE Final   Comment:            The GeneXpert MRSA Assay (FDA     approved for NASAL specimens     only), is one component of a     comprehensive MRSA colonization     surveillance program. It is not     intended to diagnose MRSA     infection nor to guide or     monitor treatment for     MRSA infections.     Studies: Dg Chest 2 View  05/02/2013   CLINICAL DATA:  Altered mental status, history of brain tumor  EXAM: CHEST  2 VIEW  COMPARISON:  April 04, 2013  FINDINGS: The lungs are well-expanded and clear. The cardiopericardial silhouette is normal in size. The pulmonary vascularity is not engorged. The mediastinum is normal in width. There is no pleural effusion. The observed portions of the bony thorax exhibit no acute abnormalities. There are degenerative changes of the right shoulder and there is an old fracture of the left 6th rib which has healed with deformity. A pacemaker generator without electrodes is again demonstrated.  IMPRESSION: There are no findings to suggest CHF nor pneumonia. There is no evidence of significant atelectasis. No other acute cardiopulmonary abnormality is demonstrated either.   Electronically Signed   By: David  Martinique   On:  05/02/2013 17:01   Ct Head Wo Contrast  05/02/2013   CLINICAL DATA:  Altered mental status  EXAM: CT HEAD WITHOUT CONTRAST  TECHNIQUE: Contiguous axial images were obtained from the base of the skull through the vertex without intravenous contrast. Study was obtained within 24 hr of patient's arrival at the emergency department.  COMPARISON:  April 04, 2013  FINDINGS: Mild diffuse atrophy is stable. The right-sided brain stimulator lead is positioned in the right subthalamic region, stable. There is no intracranial mass, hemorrhage, extra-axial fluid collection, or midline shift. There is mild small vessel disease in the centra semiovale bilaterally, stable. 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 There is no new gray-white compartment lesion.  No demonstrable acute infarct.  There is postoperative change in the vertex region on both sides, stable. Bony calvarium otherwise appears intact. Mastoid air cells are clear.  IMPRESSION: Atrophy with small vessel disease, stable. Postoperative change with brain stimulator lead in right subthalamic region, stable.   Electronically Signed   By: Lowella Grip M.D.   On: 05/02/2013 18:53    Scheduled Meds: . amantadine  100 mg Oral Daily  . carbidopa-levodopa  1 tablet Oral QID  . docusate sodium  100 mg Oral BID  . enoxaparin (LOVENOX) injection  30 mg Subcutaneous Q24H  . polyvinyl alcohol  1 drop Both Eyes BID  . QUEtiapine  25 mg Oral BID  . saccharomyces boulardii  250 mg Oral Daily  . sodium chloride  3 mL Intravenous Q12H  . tobramycin-dexamethasone  1 application Both Eyes BID   Continuous Infusions: . sodium chloride 100 mL/hr at 05/03/13 0651       Time spent: 35 minutes    [facility]  [facility] Pager [phone] If 7PM-7AM, please contact night-coverage at [email or web address], password [facility] 05/03/2013, 5:10 PM  LOS: 1 day

## 2013-05-03 NOTE — ED Provider Notes (Signed)
I have personally seen and examined the patient.  I have discussed the plan of care with the resident.  I have reviewed the documentation on PMH/FH/Soc. History.  I have reviewed the documentation of the resident and agree.  I have reviewed and agree with the ECG interpretation(s) documented by the resident.  Pt with worsening dehydration/renal failure, required IV fluids and monitoring.  Pt with worsening mental status and admission advised for further workup/monitoring  Sharyon Cable, MD 05/03/13 1836

## 2013-05-03 NOTE — Progress Notes (Signed)
UR completed. Cailan General RN CCM Case Mgmt 

## 2013-05-03 NOTE — Progress Notes (Addendum)
Techs got patient up to use bedside commode. ( I did not know that they were getting him up). When I walked into room, patient was pale and limp. Techs stated that patients was unresponsive and pupils fixed. Patient quickly regained consciousness but is confused and uncooperative. Patient keeps trying to get out of bed. 216ml bolus Ns started. Dr. Hilbert Bible paged through [facility] text. Techs instructed not to get patient up until further notice.

## 2013-05-03 NOTE — Progress Notes (Signed)
Paged hospitalist- Dr. Hilbert Bible at 21:45.

## 2013-05-03 NOTE — Evaluation (Signed)
Clinical/Bedside Swallow Evaluation Patient Details  Name: Steve Sloan MRN: [id number] Date of Birth: 25-Feb-1946  Today's Date: 05/03/2013 Time:  -     Past Medical History:  Past Medical History  Diagnosis Date  . Parkinson's disease   . Hypotension   . Brain tumor   . Encounter for intubation   . Dysphagia   . Enterocutaneous fistula   . Chronic diarrhea   . Anemia   . Anal fistula   . Cancer   . Dementia   . Nervous system device, implant, or graft complication 03/08/2013  . Post-operative state 03/08/2013  . Celiac sprue    Past Surgical History:  Past Surgical History  Procedure Laterality Date  . Pacemaker insertion    . Deep brain stimulator placement      with removal of brain cells  . Colectomy    . Subthalamic stimulator battery replacement N/A 12/22/2012    Procedure: Deep brain stimulator battery change;  Surgeon: Maeola Harman, MD;  Location: [facility];  Service: Neurosurgery;  Laterality: N/A;   HPI:  68 year old male known Parkinson's, status post brain stimulator, dementia, sprue, prior colectomy with chronic diarrhea with recent admission 04/04/13 with lethargy, dehydration and uremia. Re-admitted  1/6 from [facility] nursing home for altered mental status, dehydration. No reported fever, but decreased by mouth intake, increasing lethargy over the last couple of days. They attempted to hydrate him with IV fluids at the nursing home, however the patient pulled out his IV. In the ER the patient was found to be clinically dehydrated with a creatinine of greater than 2. He is obtunded unable to do any questions appropriately. UA negative. Chest x-ray negative. CT head negative During his admission on 12/9, He was found to have toxic metabolic encephalopathy secondary to UTI treated with ciprofloxacin. He was also found to have acute kidney injury and required IV hydration. 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 In his living will the patient does not want to have tube feeds, has a history of pretty significant oropharyngeal dysphagia. Has a history of recurrent aspiration pneumonia, chronic malnutrition. He is insistent upon regular diet and thin liquids.   Assessment / Plan / Recommendation Clinical Impression  Pt known to SLP services due to chronic dysphagia.  Today, presents with clinical symptoms of persisting dysphagia that are consistent with findings from prior MBS studies, the last being on 12/14/12.  During that study, pt presented with severe oral and mod-severe pharyngeal deficits, leading to accumulation of significant PO residue in pharynx and aspiration with inconsistent cough response.  At that time, pt participated in lengthy discussion re: options and was encouraged to discuss such options with his physician (see notes under "General" below.)  He made the decision to continue a regular diet with thin liquids with known aspiration risk.  Subsequently, per SNF MD progress notes, pt signed a waiver at facility acknowledging aspiration risks, and continued to eat a regular diet with thin liquids.  Pt's dysphagia is unchanged; pt's choice to continue POs is also unchanged.  Today, he reiterates the desire to consume regular foods/thin liquids.  There is little else our services can offer - will sign off.    Please note: Pt requires a gluten-free diet.      Aspiration Risk  Severe    Diet Recommendation Regular;Thin liquid   Liquid Administration via: Cup;Straw Medication Administration: Crushed with puree Supervision: Staff to assist with  self feeding Compensations: Small sips/bites;Hard cough after swallow Postural Changes and/or Swallow Maneuvers: Seated upright 90 degrees;Upright 30-60 min after meal    Other  Recommendations Oral Care Recommendations: Oral care BID   Follow Up Recommendations  None           SLP Swallow Goals  n/a   Swallow  Study Prior Functional Status       General Previous Swallow Assessment: several MBS studies. Last on 12/14/12.  Dx severe oral, mod-severe pharyngeal dysphagia: "We discussed possible PO diets:  1) eating a modified diet with thicker liquids to minimize but not eliminate aspiration risk (puree, nectar-thick) vs. 2) eating per his food choices and accepting aspiration likelihood. Despite repeated, simple review of results and choices, cues, repetition to facilitate recall, by end of session pt was not able to state or recall that material was transitioning to lungs, nor that it could cause pna and its consequences. He asserted that he would like to eat regular foods and drink thin liquids. He acknowledged that discussion of his diet is a recurring theme and would like for it to be resolved. Recommended to pt that he discuss with MD to identify plan. Reiterated with pt that any PO diet creates risk due to chronicity and progressive nature of his dysphagia - it is difficult to assess how much of this discussion he understood and will remember." Respiratory Status: Nasal cannula Behavior/Cognition: Alert;Cooperative Baseline Vocal Quality: Hoarse;Low vocal intensity Volitional Cough: Weak Volitional Swallow: Able to elicit    Oral/Motor/Sensory Function Overall Oral Motor/Sensory Function: Impaired (baseline impairments sensorimotor function)   Ice Chips Ice chips: Not tested   Thin Liquid Thin Liquid: Impaired Presentation: Cup;Straw Oral Phase Impairments: Reduced lingual movement/coordination Oral Phase Functional Implications: Prolonged oral transit Pharyngeal  Phase Impairments: Suspected delayed Swallow;Decreased hyoid-laryngeal movement;Multiple swallows;Wet Vocal Quality;Cough - Immediate    Nectar Thick Nectar Thick Liquid: Not tested   Honey Thick Honey Thick Liquid: Not tested   Puree Puree: Impaired Presentation: Spoon Oral Phase Impairments: Reduced lingual  movement/coordination;Impaired anterior to posterior transit Oral Phase Functional Implications: Prolonged oral transit Pharyngeal Phase Impairments: Suspected delayed Swallow;Decreased hyoid-laryngeal movement;Multiple swallows;Wet Vocal Quality;Cough - Delayed   Solid  Berwyn Bigley L. [place], [place] CCC/SLP Pager [phone]     Solid: Not tested       Blenda Mounts Laurice 05/03/2013,2:56 PM

## 2013-05-03 NOTE — Progress Notes (Signed)
Due meds given late - waiting for pt to be alert enough to take his pills. Able to take PO meds except Colace. Will continue to monitor.

## 2013-05-03 NOTE — Progress Notes (Signed)
INITIAL NUTRITION ASSESSMENT  DOCUMENTATION CODES Per approved criteria  -Severe malnutrition in the context of chronic illness -Underweight   INTERVENTION: Supplement diet as appropriate.   NUTRITION DIAGNOSIS: Malnutrition related to chronic illness as evidenced by severe fat and muscle wasting.   Goal: Support quality of life  Monitor:  Diet advancement, PO intake, pt's preferences   Reason for Assessment: Pt identified as at nutrition risk on the Malnutrition Screen Tool  68 y.o. male  Admitting Dx: <principal problem not specified>  ASSESSMENT: Pt with known Parkinsons's and dementia admitted from SNF with altered mental status. Pt has living will which states that he does not want artifical nutrition and wishes to have a regular diet despite hx of severe dysphagia.  Pt unable to answer any questions, not arousalable.  Pt with a 24% weight loss x 2 months.   Nutrition Focused Physical Exam:  Subcutaneous Fat:  Orbital Region: severe wasting Upper Arm Region: severe wasting Thoracic and Lumbar Region: severe wasting  Muscle:  Temple Region: severe wasting Clavicle Bone Region: severe wasting Clavicle and Acromion Bone Region: severe wasting Scapular Bone Region: severe wasting Dorsal Hand: severe wasting Patellar Region: severe wasting Anterior Thigh Region: severe wasting Posterior Calf Region: severe wasting  Edema: not present    Height: Ht Readings from Last 1 Encounters:  04/04/13 6\' 3"  (1.905 m)    Weight: Wt Readings from Last 1 Encounters:  05/02/13 130 lb 15.3 oz (59.4 kg)    Ideal Body Weight: 89 kg   % Ideal Body Weight: 67%  Wt Readings from Last 10 Encounters:  05/02/13 130 lb 15.3 oz (59.4 kg)  04/04/13 147 lb 4.3 oz (66.8 kg)  03/08/13 172 lb (78.019 kg)  12/21/12 168 lb (76.204 kg)  12/21/12 168 lb (76.204 kg)  08/30/12 172 lb (78.019 kg)  06/16/12 173 lb (78.472 kg)  05/04/12 172 lb 6.4 oz (78.2 kg)    Usual Body Weight:  172 lb   % Usual Body Weight: 76%  BMI:  Body mass index is 16.37 kg/(m^2).  Estimated Nutritional Needs: Kcal: 1200-1400 Protein: 55-65 grams Fluid: > 1.5 L/day  Skin: no issues noted  Diet Order: NPO  EDUCATION NEEDS: -No education needs identified at this time   Intake/Output Summary (Last 24 hours) at 05/03/13 1046 Last data filed at 05/03/13 0651  Gross per 24 hour  Intake   1000 ml  Output    600 ml  Net    400 ml    Last BM: PTA   Labs:   Recent Labs Lab 05/02/13 1632 05/02/13 1854 05/03/13 0449  NA 134*  --  139  K 5.6* 6.1* 4.4  CL 105  --  104  CO2  --   --  18*  BUN 108*  --  83*  CREATININE 2.60*  --  1.65*  CALCIUM  --   --  9.0  MG  --  2.4  --   GLUCOSE 104*  --  84    CBG (last 3)  No results found for this basename: GLUCAP,  in the last 72 hours  Scheduled Meds: . amantadine  100 mg Oral Daily  . carbidopa-levodopa  1 tablet Oral QID  . docusate sodium  100 mg Oral BID  . enoxaparin (LOVENOX) injection  30 mg Subcutaneous Q24H  . polyvinyl alcohol  1 drop Both Eyes BID  . QUEtiapine  25 mg Oral BID  . saccharomyces boulardii  250 mg Oral Daily  . sodium chloride  3 mL Intravenous Q12H  . tobramycin-dexamethasone  1 application Both Eyes BID    Continuous Infusions: . sodium chloride 100 mL/hr at 05/03/13 0160    Past Medical History  Diagnosis Date  . Parkinson's disease   . Hypotension   . Brain tumor   . Encounter for intubation   . Dysphagia   . Enterocutaneous fistula   . Chronic diarrhea   . Anemia   . Anal fistula   . Cancer   . Dementia   . Nervous system device, implant, or graft complication 10/93/2355  . Post-operative state 03/08/2013  . Celiac sprue     Past Surgical History  Procedure Laterality Date  . Pacemaker insertion    . Deep brain stimulator placement      with removal of brain cells  . Colectomy    . Subthalamic stimulator battery replacement N/A 12/22/2012    Procedure: Deep brain  stimulator battery change;  Surgeon: Erline Levine, MD;  Location: [place] [facility];  Service: Neurosurgery;  Laterality: N/A;    Maylon Peppers RD, [place], [place] Pager [phone] After Hours Pager

## 2013-05-03 NOTE — Progress Notes (Signed)
Foley cath inserted Fr16. Pt tolerated well with the procedure. Noticed a small amount of urine draining. Will continue to monitor.

## 2013-05-04 DIAGNOSIS — G9340 Encephalopathy, unspecified: Secondary | ICD-10-CM | POA: Diagnosis present

## 2013-05-04 LAB — BASIC METABOLIC PANEL
BUN: 63 mg/dL — ABNORMAL HIGH (ref 6–23)
CHLORIDE: 109 meq/L (ref 96–112)
CO2: 18 mEq/L — ABNORMAL LOW (ref 19–32)
CREATININE: 1.12 mg/dL (ref 0.50–1.35)
Calcium: 8.6 mg/dL (ref 8.4–10.5)
GFR calc non Af Amer: 66 mL/min — ABNORMAL LOW (ref >90)
GFR, EST AFRICAN AMERICAN: 76 mL/min — AB (ref >90)
Glucose, Bld: 93 mg/dL (ref 70–99)
POTASSIUM: 4.1 meq/L (ref 3.7–5.3)
SODIUM: 142 meq/L (ref 137–147)

## 2013-05-04 LAB — URINE CULTURE
Colony Count: NO GROWTH
Culture: NO GROWTH

## 2013-05-04 LAB — CBC
HEMATOCRIT: 32.9 % — AB (ref 39.0–52.0)
Hemoglobin: 11.9 g/dL — ABNORMAL LOW (ref 13.0–17.0)
MCH: 31.8 pg (ref 26.0–34.0)
MCHC: 36.2 g/dL — ABNORMAL HIGH (ref 30.0–36.0)
MCV: 88 fL (ref 78.0–100.0)
Platelets: 152 10*3/uL (ref 150–400)
RBC: 3.74 MIL/uL — AB (ref 4.22–5.81)
RDW: 15.6 % — AB (ref 11.5–15.5)
WBC: 6 10*3/uL (ref 4.0–10.5)

## 2013-05-04 MED ORDER — SODIUM CHLORIDE 0.9 % IV SOLN
INTRAVENOUS | Status: DC
  Administered 2013-05-04 – 2013-05-05 (×3): via INTRAVENOUS

## 2013-05-04 NOTE — Progress Notes (Signed)
[facility] PROGRESS NOTE  Steve Sloan [id number] DOB: Aug 17, 1945 DOA: 05/02/2013 PCP: Cyndee Brightly, MD  HPI/Subjective: Patient able to converse, more awake and alert today. Denies any fever chills, reported the hypotension last night which corrected with IV bolus.  Brief history: 68 year old male known Parkinson's, status post brain stimulator, dementia, sprue, prior colectomy with chronic diarrhea-admitted 04/04/13 with lethargy, dehydration and uremia, today sent from [facility] for altered mental status, dehydration. No reported fever, but decreased by mouth intake, increasing lethargy over the last couple of days. They attempted to hydrate him with IV fluids at the nursing home, however the patient pulled out his IV. In the ER the patient was found to be clinically dehydrated with a creatinine of greater than 2. He is obtunded unable to do any questions appropriately. UA negative. Chest x-ray negative. CT head negative  During his admission on 12/9, He was found to have toxic metabolic encephalopathy secondary to UTI treated with ciprofloxacin. He was also found to have acute kidney injury and required IV hydration. 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  In his living will the patient does not want to have tube feeds, has a history of pretty significant oropharyngeal dysphagia. Has a history of recurrent aspiration pneumonia, chronic malnutrition. He is insistent upon regular diet and thin liquids.  Assessment/Plan: Active Problems:   Parkinson's disease   Nervous system device, implant, or graft complication   Protein-calorie malnutrition, severe   Acute renal failure   Acute encephalopathy   Altered mental status -Likely secondary to acute renal failure and dehydration. -Patient has history of dementia likely secondary to Parkinson disease he came in with more  confusion. -ABG checked to rule out CO2 narcosis, does not have any evidence of CO2 overload. -Hold sedating/or chronic medications.  Acute renal failure -This is likely secondary to poor oral intake. -Patient presented with creatinine of 2.6 and BUN of 108. -Creatinine today is 1.1 and BUN is 63. Continue IV fluids. -Likely due to recent events including a UTI, chronic diarrhea and multiple medications. -Patient is at risk of the lower dehydration secondary to his history of dementia.  Parkinson disease -Restart her home medications, oral medications to be continued. -Will discuss with his family if they would change his CODE STATUS.  History of dysphagia Patient seen by SLP and recommended regular diet with the liquids.  Code Status: Full code Family Communication: Plan discussed with the patient. Disposition Plan: Remains inpatient   Consultants:  None  Procedures:  None  Antibiotics:  None   Objective: Filed Vitals:   05/04/13 1300  BP: 99/63  Pulse: 69  Temp: 97.6 F (36.4 C)  Resp: 18    Intake/Output Summary (Last 24 hours) at 05/04/13 1713 Last data filed at 05/04/13 1300  Gross per 24 hour  Intake   1661 ml  Output    926 ml  Net    735 ml   Filed Weights   05/02/13 2015  Weight: 59.4 kg (130 lb 15.3 oz)    Exam: General: Alert and awake, oriented x3, not in any acute distress. HEENT: anicteric sclera, pupils reactive to light and accommodation, EOMI CVS: S1-S2 clear, no murmur rubs or gallops Chest: clear to auscultation bilaterally, no wheezing, rales or rhonchi Abdomen: soft nontender, nondistended, normal bowel sounds, no organomegaly Extremities: no cyanosis, clubbing or edema noted bilaterally Neuro: Cranial nerves II-XII intact, no focal neurological deficits  Data  Reviewed: Basic Metabolic Panel:  Recent Labs Lab 05/02/13 1632 05/02/13 1854 05/03/13 0449 05/04/13 0626  NA 134*  --  139 142  K 5.6* 6.1* 4.4 4.1  CL 105  --   104 109  CO2  --   --  18* 18*  GLUCOSE 104*  --  84 93  BUN 108*  --  83* 63*  CREATININE 2.60*  --  1.65* 1.12  CALCIUM  --   --  9.0 8.6  MG  --  2.4  --   --    Liver Function Tests:  Recent Labs Lab 05/03/13 0449  AST 9  ALT <5  ALKPHOS 86  BILITOT 0.5  PROT 7.5  ALBUMIN 3.9   No results found for this basename: LIPASE, AMYLASE,  in the last 168 hours No results found for this basename: AMMONIA,  in the last 168 hours CBC:  Recent Labs Lab 05/02/13 1615 05/02/13 1632 05/02/13 2038 05/03/13 0449 05/04/13 0626  WBC 12.9*  --  11.9* 7.8 6.0  NEUTROABS 11.4*  --   --   --   --   HGB 15.1 14.6 15.4 13.8 11.9*  HCT 41.6 43.0 42.2 38.6* 32.9*  MCV 88.3  --  88.3 89.1 88.0  PLT 203  --  194 173 152   Cardiac Enzymes:  Recent Labs Lab 05/02/13 1854 05/03/13 0020 05/03/13 0645  TROPONINI <0.30 <0.30 <0.30   BNP (last 3 results) No results found for this basename: PROBNP,  in the last 8760 hours CBG: No results found for this basename: GLUCAP,  in the last 168 hours  Micro Recent Results (from the past 240 hour(s))  URINE CULTURE     Status: None   Collection Time    05/02/13  5:02 PM      Result Value Range Status   Specimen Description URINE, CLEAN CATCH   Final   Special Requests NONE   Final   Culture  Setup Time     Final   Value: 05/03/2013 01:50     Performed at [facility]     Final   Value: NO GROWTH     Performed at [facility]   Culture     Final   Value: NO GROWTH     Performed at [facility]   Report Status 05/04/2013 FINAL   Final  MRSA PCR SCREENING     Status: None   Collection Time    05/02/13  8:24 PM      Result Value Range Status   MRSA by PCR NEGATIVE  NEGATIVE Final   Comment:            The GeneXpert MRSA Assay (FDA     approved for NASAL specimens     only), is one component of a     comprehensive MRSA colonization     surveillance program. It is not     intended to diagnose  MRSA     infection nor to guide or     monitor treatment for     MRSA infections.     Studies: Ct Head Wo Contrast  05/02/2013   CLINICAL DATA:  Altered mental status  EXAM: CT HEAD WITHOUT CONTRAST  TECHNIQUE: Contiguous axial images were obtained from the base of the skull through the vertex without intravenous contrast. Study was obtained within 24 hr of patient's arrival at the emergency department.  COMPARISON:  April 04, 2013  FINDINGS: Mild  diffuse atrophy is stable. The right-sided brain stimulator lead is positioned in the right subthalamic region, stable. There is no intracranial mass, hemorrhage, extra-axial fluid collection, or midline shift. There is mild small vessel disease in the centra semiovale bilaterally, stable. 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 There is no new gray-white compartment lesion. No demonstrable acute infarct.  There is postoperative change in the vertex region on both sides, stable. Bony calvarium otherwise appears intact. Mastoid air cells are clear.  IMPRESSION: Atrophy with small vessel disease, stable. Postoperative change with brain stimulator lead in right subthalamic region, stable.   Electronically Signed   By: Lowella Grip M.D.   On: 05/02/2013 18:53    Scheduled Meds: . amantadine  100 mg Oral Daily  . carbidopa-levodopa  1 tablet Oral QID  . docusate sodium  100 mg Oral BID  . enoxaparin (LOVENOX) injection  30 mg Subcutaneous Q24H  . polyvinyl alcohol  1 drop Both Eyes BID  . QUEtiapine  25 mg Oral BID  . saccharomyces boulardii  250 mg Oral Daily  . sodium chloride  3 mL Intravenous Q12H  . tobramycin-dexamethasone  1 application Both Eyes BID   Continuous Infusions: . sodium chloride 75 mL/hr at 05/04/13 0914       Time spent: 35 minutes    [facility]  [facility] Pager [phone] If 7PM-7AM, please contact night-coverage at [email or web address], password [facility] 05/04/2013, 5:13 PM  LOS: 2  days

## 2013-05-04 NOTE — Care Management Note (Signed)
    Page 1 of 1   05/05/2013     3:44:49 PM   CARE MANAGEMENT NOTE 05/05/2013  Patient:  Steve Sloan, Steve Sloan   Account Number:  [id number]  Date Initiated:  05/04/2013  Documentation initiated by:  Tomi Bamberger  Subjective/Objective Assessment:   dx acute renal failure  admit- from [facility].     Action/Plan:   Anticipated DC Date:  05/05/2013   Anticipated DC Plan:  SKILLED NURSING FACILITY  In-house referral  Clinical Social Worker      DC Planning Services  CM consult      Choice offered to / List presented to:             Status of service:  Completed, signed off Medicare Important Message given?   (If response is "NO", the following Medicare IM given date fields will be blank) Date Medicare IM given:   Date Additional Medicare IM given:    Discharge Disposition:  [place]  Per UR Regulation:  Reviewed for med. necessity/level of care/duration of stay  If discussed at [place] of Stay Meetings, dates discussed:    Comments:  05/05/13 15:43 Tomi Bamberger RN ,BSN [phone] patient dc to [facility], CSW following.  05/04/13 16:46 Tomi Bamberger RN, BSN [phone] patient is from [facility] [place] following.

## 2013-05-04 NOTE — Clinical Social Work Psychosocial (Signed)
Clinical Social Work Department BRIEF PSYCHOSOCIAL ASSESSMENT 05/04/2013  Patient:  Steve Sloan, Steve Sloan     Account Number:  [id number]     Admit date:  05/02/2013  Clinical Social Worker:  Lovey Newcomer  Date/Time:  05/04/2013 04:24 PM  Referred by:  Physician  Date Referred:  05/04/2013 Referred for  SNF Placement   Other Referral:   Interview type:  Family Other interview type:   Patient not alert and oriented at time of assessment. Son and sister interviewed over phone.    PSYCHOSOCIAL DATA Living Status:  FACILITY Admitted from facility:  [facility] Level of care:  [place] Primary support name:  Kamarion Zagami. [phone] Primary support relationship to patient:  CHILD, ADULT Degree of support available:   Support is limited. Son lives in [place] and sister lives in [place]. Other children are not involved.    CURRENT CONCERNS Current Concerns  Post-Acute Placement   Other Concerns:    SOCIAL WORK ASSESSMENT / PLAN CSW spoke with patient's son via phone to confirm that patient is from [place]. Patient's son confirms this and states that he plans for patient to return at discharge. Patient's son was curious of why CSW was involved in patient's care. CSW educated patient's son on CSW's role in patient's care. Son states that he lives in [place]. The sister states that she lives in [place], [place] and is unable to visit like she would like to, due to her medical issues. Son states that patient has lived at [facility] for 2 or 3 years. Son is grateful for the assistance with taking care of his father.      [place] admission coordinator states that patient has a device that he wears that help with his communication. Admission coordinator would like to bring this device to patient to assist with him communicating with the nurses.   Assessment/plan status:  Psychosocial Support/Ongoing Assessment of Needs Other assessment/ plan:    Complete FL2, Fax   Information/referral to community resources:   CSW contact information given to son and sister.    PATIENT'S/FAMILY'S RESPONSE TO PLAN OF CARE: Patient's son plans for patient to return to [facility] at discharge. Patient son was pleasant, appropriate, and appreciative of CSW contact. Patient's son states that he is "thanksfull for the care you are providing" to his father. CSW will assist in DC.       Liz Beach, [place], [place], [phone]

## 2013-05-05 LAB — BASIC METABOLIC PANEL
BUN: 37 mg/dL — AB (ref 6–23)
CO2: 24 mEq/L (ref 19–32)
CREATININE: 0.87 mg/dL (ref 0.50–1.35)
Calcium: 8.5 mg/dL (ref 8.4–10.5)
Chloride: 113 mEq/L — ABNORMAL HIGH (ref 96–112)
GFR, EST NON AFRICAN AMERICAN: 87 mL/min — AB (ref >90)
Glucose, Bld: 93 mg/dL (ref 70–99)
POTASSIUM: 3.9 meq/L (ref 3.7–5.3)
Sodium: 145 mEq/L (ref 137–147)

## 2013-05-05 MED ORDER — LOPERAMIDE HCL 2 MG PO CAPS: 2.0000 mg | ORAL_CAPSULE | ORAL | Status: AC | PRN

## 2013-05-05 MED ORDER — CHOLESTYRAMINE 4 G PO PACK: 1.0000 | PACK | Freq: Every day | ORAL | Status: AC | PRN

## 2013-05-05 MED ORDER — QUETIAPINE FUMARATE 25 MG PO TABS: 25.0000 mg | ORAL_TABLET | Freq: Two times a day (BID) | ORAL | Status: AC

## 2013-05-05 MED ORDER — DSS 100 MG PO CAPS: 100.0000 mg | ORAL_CAPSULE | Freq: Two times a day (BID) | ORAL | Status: AC | PRN

## 2013-05-05 MED ORDER — LEVALBUTEROL HCL 0.63 MG/3ML IN NEBU: 0.6300 mg | INHALATION_SOLUTION | Freq: Four times a day (QID) | RESPIRATORY_TRACT | Status: AC | PRN

## 2013-05-05 MED ORDER — HYDROCODONE-ACETAMINOPHEN 5-325 MG PO TABS: 1.0000 | ORAL_TABLET | Freq: Four times a day (QID) | ORAL | Status: AC | PRN

## 2013-05-05 MED ORDER — ACETAMINOPHEN 325 MG PO TABS: 650.0000 mg | ORAL_TABLET | Freq: Four times a day (QID) | ORAL | Status: AC | PRN

## 2013-05-05 NOTE — Progress Notes (Signed)
Pt to d/c to [facility] via ambulance. Report called to facility.

## 2013-05-05 NOTE — Clinical Social Work Note (Signed)
Per MD patient ready to DC back to [facility]. RN, patient's son, and facility notified of DC. RN given number for report. Ambulance transport requested for patient. DC packet left on chart. CSW signing off.   Liz Beach, [place], [place], [phone]

## 2013-05-05 NOTE — Discharge Summary (Signed)
Addendum  Patient seen and examined, chart and data base reviewed.  I agree with the above assessment and discharge plan.  For full details please see Mrs. Imogene Burn PA note.  Altered mental status and acute renal failure, likely secondary to poor oral intake.  Please ensure is encouraged having adequate oral intake, palliative care consultation is recommended.   Birdie Hopes, MD [facility] Pager: [phone] 05/05/2013, 2:58 PM

## 2013-05-05 NOTE — Discharge Summary (Signed)
Physician Discharge Summary  FREMAN LAPAGE ZOX:[id number] DOB: [birth year] DOA: 05/02/2013  PCP: Cyndee Brightly, MD  Admit date: 05/02/2013 Discharge date: 05/05/2013  Time spent: 50 minutes  Recommendations for Outpatient Follow-up:  1.  BMET in 3-5 days. 2.  Ensure patient has adequate on going hydration.  Otherwise he will have recurrent AMS and Renal Failure.  3.  Recommend a Palliative Care consultation for family.  Recommend DNR status.   Discharge Diagnoses:  Principal Problem:   Acute encephalopathy Active Problems:   Acute renal failure   Parkinson's disease   Nervous system device, implant, or graft complication   Protein-calorie malnutrition, severe   Discharge Condition: improved mental status, awake and talking.  Diet recommendation: gluten free diet.  Patient needs assistance eating.  Filed Weights   05/02/13 2015  Weight: 59.4 kg (130 lb 15.3 oz)    History of present illness:  68 year old male known Parkinson's, status post brain stimulator, dementia, sprue, prior colectomy with chronic diarrhea-admitted 04/04/13 with lethargy, dehydration and uremia, today sent from [facility] home for altered mental status, dehydration. No reported fever, but decreased by mouth intake, increasing lethargy over the last couple of days. They attempted to hydrate him with IV fluids at the nursing home, however the patient pulled out his IV. In the ER the patient was found to be clinically dehydrated with a creatinine of greater than 2. He is obtunded unable to do any questions appropriately. UA negative. Chest x-ray negative. CT head negative   Hospital Course:   Altered mental status  -Likely secondary to acute renal failure and dehydration.  -Patient has history of dementia but came in obtunded.  This has resolved and the patient is now alert and speaking. -The patient's restraints were discontinued greater than 24 hours prior to discharge. -The patient no longer  needs a bedside sitter. -ABG checked to rule out CO2 narcosis, does not have any evidence of CO2 overload.  -Hold sedating/or chronic medications.   Acute renal failure  -This is likely secondary to poor fluid intake as well as possibly GI losses vs anti-diarrheal medications. -Patient presented with creatinine of 2.6 and BUN of 108.  0.87 and 37 on discharge. -Patient is at increased risk of dehydration due to dementia. This is likely to recur with out sufficient daily oral fluid intake  Parkinson disease  -medications were restarted as soon as the patient could take oral medications.  History of dysphagia  -Patient seen by SLP and recommended regular diet with the liquids.      Discharge Exam: Filed Vitals:   05/05/13 0523  BP: 117/68  Pulse: 78  Temp: 97.7 F (36.5 C)  Resp: 18    General: frail, elderly, awake, alert to person and place, attempts to follow commands. Cardiovascular: rrr  No m/r/g Difficult to hear, pace maker in place. Respiratory: cachectic chest, CTA, no increased work of breathing Abdomen:  Thin, soft, nt, nd, +bs. Extremities:  Able to move all 4, no edema.  Discharge Instructions      Discharge Orders   Future Appointments Provider Department Dept Phone   05/11/2013 10:15 AM Hulen Luster, [place] [name] [phone]   Future Orders Complete By Expires   Diet - low sodium heart healthy  As directed    Increase activity slowly  As directed        Medication List    STOP taking these medications       diphenoxylate-atropine 2.5-0.025 MG per tablet  Commonly known  as:  LOMOTIL      TAKE these medications       acetaminophen 325 MG tablet  Commonly known as:  TYLENOL  Take 2 tablets (650 mg total) by mouth every 6 (six) hours as needed for mild pain (or Fever >/= 101).     amantadine 100 MG capsule  Commonly known as:  SYMMETREL  Take 100 mg by mouth daily.     carbidopa-levodopa 50-200 MG per tablet   Commonly known as:  SINEMET CR  Take 1 tablet by mouth 4 (four) times daily.     cholestyramine 4 G packet  Commonly known as:  QUESTRAN  Take 1 packet by mouth daily as needed (loose stools).     DSS 100 MG Caps  Take 100 mg by mouth 2 (two) times daily as needed for mild constipation.     ferrous sulfate 325 (65 FE) MG tablet  Take 325 mg by mouth 2 (two) times daily with a meal.     HYDROcodone-acetaminophen 5-325 MG per tablet  Commonly known as:  NORCO/VICODIN  Take 1 tablet by mouth every 6 (six) hours as needed for moderate pain.     hydroxypropyl methylcellulose 2.5 % ophthalmic solution  Commonly known as:  ISOPTO TEARS  Place 1 drop into both eyes 2 (two) times daily.     levalbuterol 0.63 MG/3ML nebulizer solution  Commonly known as:  XOPENEX  Take 3 mLs (0.63 mg total) by nebulization every 6 (six) hours as needed for wheezing or shortness of breath.     loperamide 2 MG capsule  Commonly known as:  IMODIUM  Take 1 capsule (2 mg total) by mouth as needed for diarrhea or loose stools.     OPTIVE SENSITIVE 0.5-0.9 % Soln  Generic drug:  Carboxymethylcellul-Glycerin  Place 1 drop into both eyes 2 (two) times daily.     QUEtiapine 25 MG tablet  Commonly known as:  SEROQUEL  Take 1 tablet (25 mg total) by mouth 2 (two) times daily.     saccharomyces boulardii 250 MG capsule  Commonly known as:  FLORASTOR  Take 250 mg by mouth daily.     tobramycin-dexamethasone ophthalmic ointment  Commonly known as:  TOBRADEX  Place 1 application into both eyes 2 (two) times daily.     Vitamin D (Ergocalciferol) 50000 UNITS Caps capsule  Commonly known as:  DRISDOL  Take 50,000 Units by mouth every 30 (thirty) days. On the 25th       Allergies  Allergen Reactions  . Donepezil     Unknown       The results of significant diagnostics from this hospitalization (including imaging, microbiology, ancillary and laboratory) are listed below for reference.    Significant  Diagnostic Studies: Dg Chest 2 View  05/02/2013   CLINICAL DATA:  Altered mental status, history of brain tumor  EXAM: CHEST  2 VIEW  COMPARISON:  April 04, 2013  FINDINGS: The lungs are well-expanded and clear. The cardiopericardial silhouette is normal in size. The pulmonary vascularity is not engorged. The mediastinum is normal in width. There is no pleural effusion. The observed portions of the bony thorax exhibit no acute abnormalities. There are degenerative changes of the right shoulder and there is an old fracture of the left 6th rib which has healed with deformity. A pacemaker generator without electrodes is again demonstrated.  IMPRESSION: There are no findings to suggest CHF nor pneumonia. There is no evidence of significant atelectasis. No other acute cardiopulmonary abnormality is  demonstrated either.   Electronically Signed   By: David  Martinique   On: 05/02/2013 17:01   Ct Head Wo Contrast  05/02/2013   CLINICAL DATA:  Altered mental status  EXAM: CT HEAD WITHOUT CONTRAST  TECHNIQUE: Contiguous axial images were obtained from the base of the skull through the vertex without intravenous contrast. Study was obtained within 24 hr of patient's arrival at the emergency department.  COMPARISON:  April 04, 2013  FINDINGS: Mild diffuse atrophy is stable. The right-sided brain stimulator lead is positioned in the right subthalamic region, stable. There is no intracranial mass, hemorrhage, extra-axial fluid collection, or midline shift. There is mild small vessel disease in the centra semiovale bilaterally, stable. 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 There is no new gray-white compartment lesion. No demonstrable acute infarct.  There is postoperative change in the vertex region on both sides, stable. Bony calvarium otherwise appears intact. Mastoid air cells are clear.  IMPRESSION: Atrophy with small vessel disease, stable. Postoperative change with brain stimulator lead  in right subthalamic region, stable.   Electronically Signed   By: Lowella Grip M.D.   On: 05/02/2013 18:53    Microbiology: Recent Results (from the past 240 hour(s))  URINE CULTURE     Status: None   Collection Time    05/02/13  5:02 PM      Result Value Range Status   Specimen Description URINE, CLEAN CATCH   Final   Special Requests NONE   Final   Culture  Setup Time     Final   Value: 05/03/2013 01:50     Performed at [facility]     Final   Value: NO GROWTH     Performed at [facility]   Culture     Final   Value: NO GROWTH     Performed at [facility]   Report Status 05/04/2013 FINAL   Final  MRSA PCR SCREENING     Status: None   Collection Time    05/02/13  8:24 PM      Result Value Range Status   MRSA by PCR NEGATIVE  NEGATIVE Final   Comment:            The GeneXpert MRSA Assay (FDA     approved for NASAL specimens     only), is one component of a     comprehensive MRSA colonization     surveillance program. It is not     intended to diagnose MRSA     infection nor to guide or     monitor treatment for     MRSA infections.     Labs: Basic Metabolic Panel:  Recent Labs Lab 05/02/13 1632 05/02/13 1854 05/03/13 0449 05/04/13 0626 05/05/13 0715  NA 134*  --  139 142 145  K 5.6* 6.1* 4.4 4.1 3.9  CL 105  --  104 109 113*  CO2  --   --  18* 18* 24  GLUCOSE 104*  --  84 93 93  BUN 108*  --  83* 63* 37*  CREATININE 2.60*  --  1.65* 1.12 0.87  CALCIUM  --   --  9.0 8.6 8.5  MG  --  2.4  --   --   --    Liver Function Tests:  Recent Labs Lab 05/03/13 0449  AST 9  ALT <5  ALKPHOS 86  BILITOT 0.5  PROT 7.5  ALBUMIN  3.9   CBC:  Recent Labs Lab 05/02/13 1615 05/02/13 1632 05/02/13 2038 05/03/13 0449 05/04/13 0626  WBC 12.9*  --  11.9* 7.8 6.0  NEUTROABS 11.4*  --   --   --   --   HGB 15.1 14.6 15.4 13.8 11.9*  HCT 41.6 43.0 42.2 38.6* 32.9*  MCV 88.3  --  88.3 89.1 88.0  PLT 203  --  194 173  152   Cardiac Enzymes:  Recent Labs Lab 05/02/13 1854 05/03/13 0020 05/03/13 0645  TROPONINI <0.30 <0.30 <0.30    Signed:  Karen Kitchens [phone] [facility] 05/05/2013, 12:46 PM

## 2013-05-08 ENCOUNTER — Ambulatory Visit: Payer: Medicare Other | Admitting: Neurology

## 2013-05-09 ENCOUNTER — Non-Acute Institutional Stay (SKILLED_NURSING_FACILITY): Payer: Medicare Other | Admitting: Internal Medicine

## 2013-05-09 DIAGNOSIS — E860 Dehydration: Secondary | ICD-10-CM

## 2013-05-09 DIAGNOSIS — G20 Parkinson's disease: Secondary | ICD-10-CM

## 2013-05-09 DIAGNOSIS — K900 Celiac disease: Secondary | ICD-10-CM

## 2013-05-09 NOTE — Progress Notes (Signed)
Patient ID: Steve Sloan, male   DOB: [birth year], 68 y.o.   MRN: [id number] Facility; [facility] Chief complaint; readmission to the facility post [place] History; this is a patient with advanced Parkinson's disease and dysphagia secondary to this. 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 Initiation of a gluten-free diet really hasn't done too much to alleviate the fluid output however. He is also on Questran. He has had 2 admissions to hospital most recently earlier this month with prerenal azotemia corrected with fluids. He apparently has a living will which precludes a feeding tube. His guardian is actually a friend and we haven't been able to get a no CODE STATUS on him  Results for Steve Sloan, Steve Sloan (MRN [id number]) as of 05/09/2013 13:40  Ref. Range 05/02/2013 16:32 05/02/2013 16:57 05/02/2013 17:02 05/02/2013 18:48 05/02/2013 18:54 05/02/2013 20:24 05/02/2013 20:38 05/02/2013 21:55 05/02/2013 22:45 05/03/2013 00:20 05/03/2013 04:49 05/03/2013 06:45 05/03/2013 09:40 05/04/2013 06:26  Sample type No range found         ARTERIAL DRAW       Delivery systems No range found         NASAL CANNULA       O2 Content No range found         5.0       pH, Arterial Latest Range: 7.350-7.450          7.314 (L)       pCO2 arterial Latest Range: 35.0-45.0 mmHg         28.3 (L)       pO2, Arterial Latest Range: 80.0-100.0 mmHg         177.0 (H)       Bicarbonate Latest Range: 20.0-24.0 mEq/L         14.0 (L)       TCO2 Latest Range: 0-100 mmol/L 19        14.9       Acid-base deficit Latest Range: 0.0-2.0 mmol/L         11.0 (H)       O2 Saturation No range found         99.4       Patient temperature No range found         98.6       Collection site No range found         RIGHT RADIAL       Allens test (pass/fail) Latest Range: PASS          PASS       Sodium Latest Range: 137-147 mEq/L 134 (L)          139   142  Potassium Latest Range: 3.7-5.3 mEq/L  5.6 (H)    6.1 (H)      4.4   4.1  Chloride Latest Range: 96-112 mEq/L 105          104   109  CO2 Latest Range: 19-32 mEq/L           18 (L)   18 (L)  BUN Latest Range: 6-23 mg/dL 108 (H)          83 (H)   63 (H)  Creatinine Latest Range: 0.50-1.35 mg/dL 2.60 (H)          1.65 (H)   1.12  Calcium Latest Range: 8.4-10.5 mg/dL           9.0   8.6  GFR calc non Af Amer Latest Range: >90 mL/min           41 (L)   66 (L)  GFR calc Af Amer Latest Range: >90 mL/min           48 (L)   76 (L)  Glucose Latest Range: 70-99 mg/dL 104 (H)          84   93  Calcium Ionized Latest Range: 1.13-1.30 mmol/L 1.34 (H)               Magnesium Latest Range: 1.5-2.5 mg/dL     2.4           Alkaline Phosphatase Latest Range: 39-117 U/L           86     Albumin Latest Range: 3.5-5.2 g/dL           3.9     AST Latest Range: 0-37 U/L           9     ALT Latest Range: 0-53 U/L           <5     Total Protein Latest Range: 6.0-8.3 g/dL           7.5     Total Bilirubin Latest Range: 0.3-1.2 mg/dL           0.5     Troponin I Latest Range: <0.30 ng/mL     <0.30     <0.30  <0.30    WBC Latest Range: 4.0-10.5 K/uL       11.9 (H)    7.8   6.0  RBC Latest Range: 4.22-5.81 MIL/uL       4.78    4.33   3.74 (L)  Hemoglobin Latest Range: 13.0-17.0 g/dL 14.6      15.4    13.8   11.9 (L)  HCT Latest Range: 39.0-52.0 % 43.0      42.2    38.6 (L)   32.9 (L)  MCV Latest Range: 78.0-100.0 fL       88.3    89.1   88.0  MCH Latest Range: 26.0-34.0 pg       32.2    31.9   31.8  MCHC Latest Range: 30.0-36.0 g/dL       36.5 (H)    35.8   36.2 (H)  RDW LReview of systems atest Range: 11.5-15.5 %       15.6 (H)    15.6 (H)   15.6 (H)  Platelets Latest Range: 150-400 K/uL       194    173   152  TSH Latest Range: 0.350-4.500 uIU/mL       0.950         Influenza A By PCR Latest Range: NEGATIVE         NEGATIVE        Influenza B By PCR Latest Range: NEGATIVE         NEGATIVE        H1N1 flu by pcr Latest Range: NOT DETECTED         NOT  DETECTED        Color, Urine Latest Range: YELLOW    YELLOW             APPearance Latest Range: CLEAR    CLEAR             Specific Gravity, Urine Latest Range: 1.005-1.030    1.021  pH Latest Range: 5.0-8.0    5.0             Glucose Latest Range: NEGATIVE mg/dL   NEGATIVE             Bilirubin Urine Latest Range: NEGATIVE    NEGATIVE             Ketones, ur Latest Range: NEGATIVE mg/dL   NEGATIVE             Protein Latest Range: NEGATIVE mg/dL   NEGATIVE             Urobilinogen, UA Latest Range: 0.0-1.0 mg/dL   0.2             Nitrite Latest Range: NEGATIVE    NEGATIVE             Leukocytes, UA Latest Range: NEGATIVE    NEGATIVE             Hgb urine dipstick Latest Range: NEGATIVE    NEGATIVE             MRSA by PCR Latest Range: NEGATIVE       NEGATIVE          URINE CULTURE No range found   Rpt             CT HEAD WO CONTRAST No range found    Rpt            DG CHEST 2 VIEW No range found  Rpt              EKG No range found             Attch         CLINICAL DATA:  Altered mental status   EXAM: CT HEAD WITHOUT CONTRAST   TECHNIQUE: Contiguous axial images were obtained from the base of the skull through the vertex without intravenous contrast. Study was obtained within 24 hr of patient's arrival at the emergency department.   COMPARISON:  April 04, 2013   FINDINGS: Mild diffuse atrophy is stable. The right-sided brain stimulator lead is positioned in the right subthalamic region, stable. There is no intracranial mass, hemorrhage, extra-axial fluid collection, or midline shift. There is mild small vessel disease in the centra semiovale bilaterally, stable. 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 There is no new gray-white compartment lesion. No demonstrable acute infarct.   There is postoperative change in the vertex region on both sides, stable. Bony calvarium otherwise appears intact. Mastoid air cells are clear.    IMPRESSION: Atrophy with small vessel disease, stable. Postoperative change with brain stimulator lead in right subthalamic region, stable.     Electronically Signed   By: Lowella Grip M.D.   On: 05/02/2013 18:53   Past Medical History  Diagnosis Date  . Parkinson's disease   . Hypotension   . Brain tumor   . Encounter for intubation   . Dysphagia   . Enterocutaneous fistula   . Chronic diarrhea   . Anemia   . Anal fistula   . Cancer   . Dementia   . Nervous system device, implant, or graft complication [id number]  . Post-operative state 03/08/2013  . Celiac sprue    Past Surgical History  Procedure Laterality Date  . Pacemaker insertion    . Deep brain stimulator placement      with removal of brain cells  . Colectomy    . Subthalamic stimulator battery  replacement N/A 12/22/2012    Procedure: Deep brain stimulator battery change;  Surgeon: Erline Levine, MD;  Location: [facility];  Service: Neurosurgery;  Laterality: N/A;   Current Outpatient Prescriptions on File Prior to Visit  Medication Sig Dispense Refill  . acetaminophen (TYLENOL) 325 MG tablet Take 2 tablets (650 mg total) by mouth every 6 (six) hours as needed for mild pain (or Fever >/= 101).      Marland Kitchen amantadine (SYMMETREL) 100 MG capsule Take 100 mg by mouth daily.      . carbidopa-levodopa (SINEMET CR) 50-200 MG per tablet Take 1 tablet by mouth 4 (four) times daily.      . Carboxymethylcellul-Glycerin (OPTIVE SENSITIVE) 0.5-0.9 % SOLN Place 1 drop into both eyes 2 (two) times daily.      . cholestyramine (QUESTRAN) 4 G packet Take 1 packet by mouth daily as needed (loose stools).  60 each  12  . docusate sodium 100 MG CAPS Take 100 mg by mouth 2 (two) times daily as needed for mild constipation.  10 capsule  0  . ferrous sulfate 325 (65 FE) MG tablet Take 325 mg by mouth 2 (two) times daily with a meal.       . HYDROcodone-acetaminophen (NORCO/VICODIN) 5-325 MG per tablet Take 1 tablet by mouth every 6  (six) hours as needed for moderate pain.  30 tablet  0  . hydroxypropyl methylcellulose (ISOPTO TEARS) 2.5 % ophthalmic solution Place 1 drop into both eyes 2 (two) times daily.      Marland Kitchen levalbuterol (XOPENEX) 0.63 MG/3ML nebulizer solution Take 3 mLs (0.63 mg total) by nebulization every 6 (six) hours as needed for wheezing or shortness of breath.  3 mL  12  . loperamide (IMODIUM) 2 MG capsule Take 1 capsule (2 mg total) by mouth as needed for diarrhea or loose stools.  30 capsule  0  . QUEtiapine (SEROQUEL) 25 MG tablet Take 1 tablet (25 mg total) by mouth 2 (two) times daily.  30 tablet  0  . saccharomyces boulardii (FLORASTOR) 250 MG capsule Take 250 mg by mouth daily.      Marland Kitchen tobramycin-dexamethasone (TOBRADEX) ophthalmic ointment Place 1 application into both eyes 2 (two) times daily.      . Vitamin D, Ergocalciferol, (DRISDOL) 50000 UNITS CAPS capsule Take 50,000 Units by mouth every 30 (thirty) days. On the 25th       No current facility-administered medications on file prior to visit.    Social; the patient has a legal guardian was actually a friend not a family member History   Social History  . Marital Status: Divorced    Spouse Name: N/A    Number of Children: 3  . Years of Education: N/A   Occupational History  . disabled    Social History Main Topics  . Smoking status: Never Smoker   . Smokeless tobacco: Never Used  . Alcohol Use: No  . Drug Use: No  . Sexual Activity: Not on file   Other Topics Concern  . Not on file   Social History Narrative   He is retired from [employer].  Is working as a Psychologist, occupational at the [employer] .   He has been married to his wife, Pamala Hurry, for 57 years, with whom he has four children. All have their own families by now, all healthy.    Drinks coffee in the morning.    Denies alcohol, tobacco and drug use.   Review of systems; This is not possible  from the patient although he has lost 13 pounds since the beginning of December.  Oral intake apparently used to decline  Physical exam Gen. somewhat frail man but in no distress loose cough noted Respiratory upper airway sounds no crackles or wheezes work of breathing appears normal Cardiac he does not appear to be dehydrated otherwise heart tones are normal. Abdomen no liver spleen or masses ileostomy noted Neurologic; his peripheral manifestations of parkinsonism seem under good control per one would wonder whether this is the only etiology behind his dysphagia.  Impression/ Plan  #1 Parkinson's disease with Parkinson's dementia complex #2 significant increase in dysphagia related to #1 rule out Seroquel. Certain parkinsonism syndromes are very sensitive to neuroleptics #3 prerenal azotemia corrected in the hospital but this appears to be recurrent. #4 full CODE STATUS, the patient actually has a legal guardian will need to talk to him. The living will that is quoted in come health Link is not a part of his nursing home record [precluding tube feeding) #5 rapidly declining cognition either part of his dementia/Parkinson's complex. If there is another issue here we have not been able to easily identify it #6 celiac disease also status post ileostomy. He appears unable to keep up with his fluid output

## 2013-05-10 ENCOUNTER — Other Ambulatory Visit: Payer: Self-pay | Admitting: *Deleted

## 2013-05-10 MED ORDER — DIPHENOXYLATE-ATROPINE 2.5-0.025 MG PO TABS: ORAL_TABLET | ORAL | Status: AC

## 2013-05-11 ENCOUNTER — Other Ambulatory Visit: Payer: Self-pay | Admitting: *Deleted

## 2013-05-11 ENCOUNTER — Ambulatory Visit: Payer: Medicare Other | Admitting: Neurology

## 2013-05-11 ENCOUNTER — Telehealth: Payer: Self-pay | Admitting: *Deleted

## 2013-05-11 MED ORDER — MORPHINE SULFATE (CONCENTRATE) 20 MG/ML PO SOLN: ORAL | Status: AC

## 2013-05-11 NOTE — Telephone Encounter (Signed)
Spoke to the nursing home and the patient has been turned over to Hospice. He will not be having any [facility]ore appointments. The scheduler was suppose to call and cancel.

## 2013-05-17 NOTE — Telephone Encounter (Signed)
Closing note 

## 2013-05-19 NOTE — Discharge Summary (Signed)
Physician Discharge Summary  Patient ID: Steve Sloan MRN: [id number] DOB/AGE: [id number] y.o.  Admit date: 12/22/2012 Discharge date: 05/19/2013  Admission Diagnoses:Parkinson's Disease  Discharge Diagnoses: Same Active Problems:   * No active hospital problems. *   Discharged Condition: good  Hospital Course: Uncomplicated revision of Deep Brain Stimulator Pulse Generator for Parkinson's Disease  Consults: None  Significant Diagnostic Studies: None  Treatments: surgery: Uncomplicated revision of Deep Brain Stimulator Pulse Generator for Parkinson's Disease  Discharge Exam: Blood pressure 158/71, pulse 83, temperature 98 F (36.7 C), temperature source Oral, resp. rate 17, height 6\' 3"  (1.905 m), weight 76.204 kg (168 lb), SpO2 98.00%. Neurologic: Grossly normal Wound:CDI  Disposition: SNF     Medication List    ASK your doctor about these medications       amantadine 100 MG capsule  Commonly known as:  SYMMETREL  Take 100 mg by mouth daily.     carbidopa-levodopa 50-200 MG per tablet  Commonly known as:  SINEMET CR  Take 1 tablet by mouth 4 (four) times daily. 9am, 1pm, 5pm, 9pm     cholestyramine 4 G packet  Commonly known as:  QUESTRAN  Take 1 packet by mouth every morning.     ferrous sulfate 325 (65 FE) MG tablet  Take 325 mg by mouth 2 (two) times daily with a meal.     hydroxypropyl methylcellulose 2.5 % ophthalmic solution  Commonly known as:  ISOPTO TEARS  Place 1 drop into both eyes 2 (two) times daily.     loperamide 2 MG capsule  Commonly known as:  IMODIUM  Take 2 mg by mouth 2 (two) times daily.     OPTIVE SENSITIVE 0.5-0.9 % Soln  Generic drug:  Carboxymethylcellul-Glycerin  Place 1 drop into both eyes 2 (two) times daily.     QUEtiapine 25 MG tablet  Commonly known as:  SEROQUEL  Take 25 mg by mouth 2 (two) times daily.     saccharomyces boulardii 250 MG capsule  Commonly known as:  FLORASTOR  Take 250 mg by mouth daily.      tobramycin-dexamethasone ophthalmic ointment  Commonly known as:  TOBRADEX  Place 1 application into both eyes 2 (two) times daily.     Vitamin D (Ergocalciferol) 50000 UNITS Caps capsule  Commonly known as:  DRISDOL  Take 50,000 Units by mouth every 30 (thirty) days. On the 25th         Signed: Peggyann Shoals, MD 05/19/2013, 2:18 PM

## 2013-05-28 DEATH — deceased
